# Patient Record
Sex: Female | Born: 1975
Health system: Southern US, Community
[De-identification: ages and names within clinical notes are randomized; demographics above are authoritative.]

## PROBLEM LIST (undated history)

## (undated) DIAGNOSIS — F32A Depression, unspecified: Secondary | ICD-10-CM

## (undated) DIAGNOSIS — E079 Disorder of thyroid, unspecified: Secondary | ICD-10-CM

## (undated) DIAGNOSIS — F329 Major depressive disorder, single episode, unspecified: Secondary | ICD-10-CM

## (undated) DIAGNOSIS — T7840XA Allergy, unspecified, initial encounter: Secondary | ICD-10-CM

## (undated) DIAGNOSIS — R87619 Unspecified abnormal cytological findings in specimens from cervix uteri: Secondary | ICD-10-CM

## (undated) DIAGNOSIS — F419 Anxiety disorder, unspecified: Secondary | ICD-10-CM

## (undated) DIAGNOSIS — IMO0001 Reserved for inherently not codable concepts without codable children: Secondary | ICD-10-CM

## (undated) HISTORY — DX: Anxiety disorder, unspecified: F41.9

## (undated) HISTORY — DX: Major depressive disorder, single episode, unspecified: F32.9

## (undated) HISTORY — DX: Depression, unspecified: F32.A

## (undated) HISTORY — DX: Disorder of thyroid, unspecified: E07.9

## (undated) HISTORY — DX: Reserved for inherently not codable concepts without codable children: IMO0001

## (undated) HISTORY — DX: Unspecified abnormal cytological findings in specimens from cervix uteri: R87.619

## (undated) HISTORY — DX: Allergy, unspecified, initial encounter: T78.40XA

---

## 2000-02-04 DIAGNOSIS — R87619 Unspecified abnormal cytological findings in specimens from cervix uteri: Secondary | ICD-10-CM

## 2000-02-04 HISTORY — PX: LEEP: SHX91

## 2000-02-04 HISTORY — DX: Unspecified abnormal cytological findings in specimens from cervix uteri: R87.619

## 2005-02-03 DIAGNOSIS — E079 Disorder of thyroid, unspecified: Secondary | ICD-10-CM

## 2005-02-03 HISTORY — DX: Disorder of thyroid, unspecified: E07.9

## 2008-02-04 HISTORY — PX: BILATERAL CARPAL TUNNEL RELEASE: SHX6508

## 2014-02-03 DIAGNOSIS — F419 Anxiety disorder, unspecified: Secondary | ICD-10-CM

## 2014-02-03 HISTORY — DX: Anxiety disorder, unspecified: F41.9

## 2015-05-04 ENCOUNTER — Encounter: Payer: Self-pay | Admitting: Internal Medicine

## 2015-05-04 ENCOUNTER — Ambulatory Visit (INDEPENDENT_AMBULATORY_CARE_PROVIDER_SITE_OTHER): Payer: Self-pay | Admitting: Internal Medicine

## 2015-05-04 VITALS — BP 128/84 | HR 78 | Resp 17 | Ht 64.0 in | Wt 272.0 lb

## 2015-05-04 DIAGNOSIS — E039 Hypothyroidism, unspecified: Secondary | ICD-10-CM

## 2015-05-04 DIAGNOSIS — Z91048 Other nonmedicinal substance allergy status: Secondary | ICD-10-CM

## 2015-05-04 DIAGNOSIS — Z9109 Other allergy status, other than to drugs and biological substances: Secondary | ICD-10-CM

## 2015-05-04 DIAGNOSIS — F411 Generalized anxiety disorder: Secondary | ICD-10-CM | POA: Insufficient documentation

## 2015-05-04 DIAGNOSIS — F329 Major depressive disorder, single episode, unspecified: Secondary | ICD-10-CM

## 2015-05-04 DIAGNOSIS — F32A Depression, unspecified: Secondary | ICD-10-CM

## 2015-05-04 MED ORDER — DULOXETINE HCL 60 MG PO CPEP: 60.0000 mg | ORAL_CAPSULE | Freq: Every day | ORAL | Status: DC

## 2015-05-04 MED ORDER — CETIRIZINE HCL 10 MG PO TABS
10.0000 mg | ORAL_TABLET | Freq: Every day | ORAL | Status: DC
Start: 2015-05-04 — End: 2015-08-10

## 2015-05-04 MED ORDER — LEVOTHYROXINE SODIUM 75 MCG PO TABS: 75.0000 ug | ORAL_TABLET | Freq: Every day | ORAL | Status: DC

## 2015-05-04 NOTE — Progress Notes (Addendum)
   Subjective:    Patient ID: Victoria Dixon, female    DOB: 07/31/1975, 40 y.o.   MRN: [id number]  HPI   1.  Depression and GAD:  Depression diagnosed in 1998 and GAD diagnosed 2016.  Feels she is doing well on Cymbalta.  Took Zoloft for many years, but at one point, felt it lost its effectiveness.    2.  Hypothyroidism:  Diagnosed 2007.  Has been on current dose for years.  Last TSH was 2 years ago.  3.  Vitamin D deficient:  Takes 5,000 iu daily.  Diagnosed about 4 years ago.  4.  Environmental and probably seasonal allergies:  Currently taking Loratadine 10 mg daily and has done so for years.  Able to get Zyrtec 10 mg daily less expensively at [facility].  She is willing to try that.    Current outpatient prescriptions:  .  DULoxetine (CYMBALTA) 60 MG capsule, Take 60 mg by mouth daily., Disp: , Rfl:  .  levothyroxine (SYNTHROID, LEVOTHROID) 75 MCG tablet, Take 75 mcg by mouth daily before breakfast., Disp: , Rfl:   Loratidine 10 mg by mouth once daily Vitamin D 5,000 IU daily  Allergies  Allergen Reactions  . Azithromycin Nausea Only    And dizziness   Past Medical History  Diagnosis Date  . Allergy     Eyes, nose symptoms  . Anxiety 2016  . Depression     1998  . Abnormal Pap smear of cervix 2002    LEEP procedure followed by cryosurgery  . Thyroid disease 2007    Hypothyroidism   Past Surgical History  Procedure Laterality Date  . Leep  2002    For abnormal pap, followed by cryosurgery  . Bilateral carpal tunnel release Bilateral 2010   Family History  Problem Relation Age of Onset  . Arthritis Mother     Mixed Connective Tissue Disease/Sjogrens  . Hypertension Mother   . Depression Mother   . Anxiety disorder Mother   . Prostate cancer Father   . Bradycardia Father   . Heart disease Sister     2 MIs  . Eczema Sister   . Mental illness Sister   . Irritable bowel syndrome Sister      Review of Systems     Objective:   Physical Exam  HEENT:  PERRL,  EOMI, conjunctivae mildly injected, TMs pearly gray, throat without injection or cobbling. Neck:  Supple, No adenopathy or thyromeglay Chest:  CTA CV:  RRR without murmur or rub, radial pulses normal and equal Extrems:  No edema      Assessment & Plan:  1.  Depression and GAD:  Will fill out paperwork for Cymbalta.  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  Not interested in counseling.  2.  Environmental Allergies:  Better coverage with Zyrtec at [facility]--sent.  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  Hypothyroidism:  Check TSH with fasting labs with CPE in 3-4 months

## 2015-05-04 NOTE — Patient Instructions (Signed)
Drink a glass of water before every meal Drink 6-8 glasses of water daily Eat three meals daily Eat a protein and healthy fat with every meal (eggs,fish, chicken, [place] and limit red meats) Eat 5 servings of vegetables daily, mix the colors Eat 2 servings of fruit daily with skin, if skin is edible Use smaller plates Put food/utensils down as you chew and swallow each bite Eat at a table with friends/family at least once daily, no TV Do not eat in front of the TV

## 2015-05-14 ENCOUNTER — Telehealth: Payer: Self-pay | Admitting: Internal Medicine

## 2015-05-14 ENCOUNTER — Other Ambulatory Visit: Payer: Self-pay | Admitting: Internal Medicine

## 2015-05-14 DIAGNOSIS — F329 Major depressive disorder, single episode, unspecified: Secondary | ICD-10-CM

## 2015-05-14 DIAGNOSIS — F32A Depression, unspecified: Secondary | ICD-10-CM

## 2015-05-14 MED ORDER — DULOXETINE HCL 60 MG PO CPEP: 60.0000 mg | ORAL_CAPSULE | Freq: Every day | ORAL | Status: DC

## 2015-05-14 NOTE — Telephone Encounter (Signed)
Patient called to check if Dr. Amil Amen had faxed refill information to [facility] for "CYMBALTA".  Patient only has 3 days left of Rx.  Patient stated she left form for fax with Dr. Amil Amen.  Patient stated that Dr. Amil Amen was to send this in as an Urgent Fax request.  Patient can be reached at [phone].

## 2015-05-22 ENCOUNTER — Telehealth: Payer: Self-pay | Admitting: Internal Medicine

## 2015-05-22 NOTE — Telephone Encounter (Signed)
Called and spoke with patient.  Notified her Cymbalta through [facility] is here x 4 months She will pick up tomorrow

## 2015-08-03 ENCOUNTER — Ambulatory Visit: Payer: No Typology Code available for payment source | Admitting: Internal Medicine

## 2015-08-10 ENCOUNTER — Encounter: Payer: Self-pay | Admitting: Internal Medicine

## 2015-08-10 ENCOUNTER — Ambulatory Visit (INDEPENDENT_AMBULATORY_CARE_PROVIDER_SITE_OTHER): Payer: BLUE CROSS/BLUE SHIELD | Admitting: Internal Medicine

## 2015-08-10 VITALS — BP 110/68 | HR 89 | Resp 16 | Ht 64.0 in | Wt 284.0 lb

## 2015-08-10 DIAGNOSIS — E038 Other specified hypothyroidism: Secondary | ICD-10-CM

## 2015-08-10 DIAGNOSIS — E559 Vitamin D deficiency, unspecified: Secondary | ICD-10-CM | POA: Diagnosis not present

## 2015-08-10 DIAGNOSIS — F411 Generalized anxiety disorder: Secondary | ICD-10-CM | POA: Diagnosis not present

## 2015-08-10 DIAGNOSIS — F32A Depression, unspecified: Secondary | ICD-10-CM

## 2015-08-10 DIAGNOSIS — J3089 Other allergic rhinitis: Secondary | ICD-10-CM | POA: Insufficient documentation

## 2015-08-10 DIAGNOSIS — E034 Atrophy of thyroid (acquired): Secondary | ICD-10-CM | POA: Insufficient documentation

## 2015-08-10 DIAGNOSIS — Z6841 Body Mass Index (BMI) 40.0 and over, adult: Secondary | ICD-10-CM

## 2015-08-10 DIAGNOSIS — F329 Major depressive disorder, single episode, unspecified: Secondary | ICD-10-CM

## 2015-08-10 MED ORDER — PHENTERMINE-TOPIRAMATE ER 7.5-46 MG PO CP24: 1.0000 | ORAL_CAPSULE | Freq: Every day | ORAL | Status: DC

## 2015-08-10 NOTE — Patient Instructions (Signed)
Breast Self-Awareness Practicing breast self-awareness may pick up problems early, prevent significant medical complications, and possibly save your life. By practicing breast self-awareness, you can become familiar with how your breasts look and feel and if your breasts are changing. This allows you to notice changes early. It can also offer you some reassurance that your breast health is good. One way to learn what is normal for your breasts and whether your breasts are changing is to do a breast self-exam. If you find a lump or something that was not present in the past, it is best to contact your caregiver right away. Other findings that should be evaluated by your caregiver include nipple discharge, especially if it is bloody; skin changes or reddening; areas where the skin seems to be pulled in (retracted); or new lumps and bumps. Breast pain is seldom associated with cancer (malignancy), but should also be evaluated by a caregiver. HOW TO PERFORM A BREAST SELF-EXAM The best time to examine your breasts is 5-7 days after your menstrual period is over. During menstruation, the breasts are lumpier, and it may be more difficult to pick up changes. If you do not menstruate, have reached menopause, or had your uterus removed (hysterectomy), you should examine your breasts at regular intervals, such as monthly. If you are breastfeeding, examine your breasts after a feeding or after using a breast pump. Breast implants do not decrease the risk for lumps or tumors, so continue to perform breast self-exams as recommended. Talk to your caregiver about how to determine the difference between the implant and breast tissue. Also, talk about the amount of pressure you should use during the exam. Over time, you will become more familiar with the variations of your breasts and more comfortable with the exam. A breast self-exam requires you to remove all your clothes above the waist. 1. Look at your breasts and nipples.  Stand in front of a mirror in a room with good lighting. With your hands on your hips, push your hands firmly downward. Look for a difference in shape, contour, and size from one breast to the other (asymmetry). Asymmetry includes puckers, dips, or bumps. Also, look for skin changes, such as reddened or scaly areas on the breasts. Look for nipple changes, such as discharge, dimpling, repositioning, or redness. 2. Carefully feel your breasts. This is best done either in the shower or tub while using soapy water or when flat on your back. Place the arm (on the side of the breast you are examining) above your head. Use the pads (not the fingertips) of your three middle fingers on your opposite hand to feel your breasts. Start in the underarm area and use  inch (2 cm) overlapping circles to feel your breast. Use 3 different levels of pressure (light, medium, and firm pressure) at each circle before moving to the next circle. The light pressure is needed to feel the tissue closest to the skin. The medium pressure will help to feel breast tissue a little deeper, while the firm pressure is needed to feel the tissue close to the ribs. Continue the overlapping circles, moving downward over the breast until you feel your ribs below your breast. Then, move one finger-width towards the center of the body. Continue to use the  inch (2 cm) overlapping circles to feel your breast as you move slowly up toward the collar bone (clavicle) near the base of the neck. Continue the up and down exam using all 3 pressures until you reach the  middle of the chest. Do this with each breast, carefully feeling for lumps or changes. 3.  Keep a written record with breast changes or normal findings for each breast. By writing this information down, you do not need to depend only on memory for size, tenderness, or location. Write down where you are in your menstrual cycle, if you are still menstruating. Breast tissue can have some lumps or  thick tissue. However, see your caregiver if you find anything that concerns you.  SEEK MEDICAL CARE IF:  You see a change in shape, contour, or size of your breasts or nipples.   You see skin changes, such as reddened or scaly areas on the breasts or nipples.   You have an unusual discharge from your nipples.   You feel a new lump or unusually thick areas.    This information is not intended to replace advice given to you by your health care provider. Make sure you discuss any questions you have with your health care provider.   Document Released: 01/20/2005 Document Revised: 01/07/2012 Document Reviewed: 05/07/2011 Elsevier Interactive Patient Education [employer].

## 2015-08-10 NOTE — Progress Notes (Signed)
Date:  08/10/2015   Name:  Victoria Dixon   DOB:  [birth year]   MRN:  [id number]   Chief Complaint: Establish Care; Hypothyroidism; and Depression Depression        This is a chronic problem.  The onset quality is gradual.   The problem occurs rarely.  The problem has been resolved since onset.  Associated symptoms include no fatigue and no headaches.  Past treatments include SNRIs - Serotonin and norepinephrine reuptake inhibitors.  Compliance with treatment is good.  Previous treatment provided significant relief.  Past medical history includes thyroid problem.   Thyroid Problem Presents for follow-up visit. The condition has lasted for 14 years. Patient reports no anxiety, constipation, depressed mood, diarrhea, fatigue, leg swelling, menstrual problem or palpitations. The symptoms have been stable (has not had labs in more than one year). Past treatments include levothyroxine. The treatment provided significant relief.   Vitamin D Def - has been low over the past few years.  Now on high dose supplements. Has not had labs done in some time.  Obesity - she previously took Qsymia and did very well - lost about 70 lbs in a year.  However, the number of authorized refills expired and she has gained back some weight.   Review of Systems  Constitutional: Positive for unexpected weight change. Negative for fever, chills and fatigue.  Respiratory: Negative for cough, chest tightness, shortness of breath and wheezing.   Cardiovascular: Negative for chest pain, palpitations and leg swelling.  Gastrointestinal: Negative for abdominal pain, diarrhea and constipation.  Genitourinary: Negative for menstrual problem.  Allergic/Immunologic: Positive for environmental allergies.  Neurological: Negative for dizziness and headaches.  Psychiatric/Behavioral: Positive for depression. Negative for dysphoric mood. The patient is not nervous/anxious.     Patient Active Problem List   Diagnosis Date Noted    . Environmental and seasonal allergies 08/10/2015  . Hypothyroidism due to acquired atrophy of thyroid 08/10/2015  . Vitamin D deficiency 08/10/2015  . Depression 05/04/2015  . Generalized anxiety disorder 05/04/2015    Prior to Admission medications   Medication Sig Start Date End Date Taking? Authorizing Provider  DULoxetine (CYMBALTA) 60 MG capsule Take 1 capsule (60 mg total) by mouth daily. 05/14/15  Yes Mack Hook, MD  etonogestrel (NEXPLANON) 68 MG IMPL implant 1 each by Subdermal route once.   Yes Historical Provider, MD  fexofenadine (ALLEGRA) 180 MG tablet Take 180 mg by mouth daily.   Yes Historical Provider, MD  levothyroxine (SYNTHROID, LEVOTHROID) 75 MCG tablet Take 1 tablet (75 mcg total) by mouth daily before breakfast. 05/04/15  Yes Mack Hook, MD  Vitamin D, Ergocalciferol, (DRISDOL) 50000 units CAPS capsule Take 50,000 Units by mouth every 7 (seven) days.   Yes Historical Provider, MD    Allergies  Allergen Reactions  . Azithromycin Nausea Only    And dizziness    Past Surgical History  Procedure Laterality Date  . Leep  2002    For abnormal pap, followed by cryosurgery  . Bilateral carpal tunnel release Bilateral 2010    Social History  Substance Use Topics  . Smoking status: Never Smoker   . Smokeless tobacco: Never Used  . Alcohol Use: 0.0 oz/week    0 Standard drinks or equivalent per week     Comment: Rarely     Medication list has been reviewed and updated.   Physical Exam  Constitutional: She is oriented to person, place, and time. She appears well-developed. No distress.  HENT:  Head: Normocephalic  and atraumatic.  Neck: Normal range of motion. Neck supple. Carotid bruit is not present. No thyromegaly present.  Cardiovascular: Normal rate, regular rhythm and normal heart sounds.   Pulmonary/Chest: Effort normal and breath sounds normal. No respiratory distress.  Musculoskeletal: Normal range of motion.  Lymphadenopathy:     She has no cervical adenopathy.  Neurological: She is alert and oriented to person, place, and time. She has normal reflexes.  Skin: Skin is warm and dry. No rash noted.  Psychiatric: She has a normal mood and affect. Her speech is normal and behavior is normal. Thought content normal.  Nursing note and vitals reviewed.   BP 110/68 mmHg  Pulse 89  Resp 16  Ht 5' 4"  (1.626 m)  Wt 284 lb (128.822 kg)  BMI 48.72 kg/m2  SpO2 98%  Assessment and Plan: 1. Hypothyroidism due to acquired atrophy of thyroid Continue current dose - will adjust if needed - Lipid panel - TSH  2. Depression Doing well on Cymbalta  3. Generalized anxiety disorder controlled  4. Vitamin D deficiency supplemented - VITAMIN D 25 Hydroxy (Vit-D Deficiency, Fractures)  5. Adult BMI 45.0-49.9 kg/sq m ([place]) Will start with lower dose medication - will need to get PA and patient aware it might not be approved - Phentermine-Topiramate (QSYMIA) 7.5-46 MG CP24; Take 1 capsule by mouth daily.  Dispense: 14 capsule; Refill: 0 - Comprehensive metabolic panel   Halina Maidens, MD [facility]  08/10/2015

## 2015-08-11 LAB — COMPREHENSIVE METABOLIC PANEL
ALT: 14 IU/L (ref 0–32)
AST: 16 IU/L (ref 0–40)
Albumin/Globulin Ratio: 1.3 (ref 1.2–2.2)
Albumin: 3.9 g/dL (ref 3.5–5.5)
Alkaline Phosphatase: 100 IU/L (ref 39–117)
BUN/Creatinine Ratio: 16 (ref 9–23)
BUN: 10 mg/dL (ref 6–20)
Bilirubin Total: 0.4 mg/dL (ref 0.0–1.2)
CO2: 25 mmol/L (ref 18–29)
Calcium: 9.3 mg/dL (ref 8.7–10.2)
Chloride: 102 mmol/L (ref 96–106)
Creatinine, Ser: 0.61 mg/dL (ref 0.57–1.00)
GFR calc Af Amer: 132 mL/min/{1.73_m2} (ref >59)
GFR calc non Af Amer: 115 mL/min/{1.73_m2} (ref >59)
Globulin, Total: 3 g/dL (ref 1.5–4.5)
Glucose: 102 mg/dL — ABNORMAL HIGH (ref 65–99)
Potassium: 4.7 mmol/L (ref 3.5–5.2)
Sodium: 142 mmol/L (ref 134–144)
Total Protein: 6.9 g/dL (ref 6.0–8.5)

## 2015-08-11 LAB — LIPID PANEL
Chol/HDL Ratio: 2.9 ratio units (ref 0.0–4.4)
Cholesterol, Total: 158 mg/dL (ref 100–199)
HDL: 54 mg/dL (ref >39)
LDL Calculated: 87 mg/dL (ref 0–99)
Triglycerides: 84 mg/dL (ref 0–149)
VLDL Cholesterol Cal: 17 mg/dL (ref 5–40)

## 2015-08-11 LAB — VITAMIN D 25 HYDROXY (VIT D DEFICIENCY, FRACTURES): Vit D, 25-Hydroxy: 48.5 ng/mL (ref 30.0–100.0)

## 2015-08-11 LAB — TSH: TSH: 0.665 u[IU]/mL (ref 0.450–4.500)

## 2015-08-17 ENCOUNTER — Telehealth: Payer: Self-pay

## 2015-08-17 NOTE — Telephone Encounter (Signed)
Sent PA to plan and am awaiting review for Qysmia

## 2015-08-20 ENCOUNTER — Telehealth: Payer: Self-pay

## 2015-08-20 NOTE — Telephone Encounter (Signed)
Patient called to check on the status of her PA for Qsymia, notified her that the PA has been started and that we are waiting for a determination.

## 2015-08-23 ENCOUNTER — Other Ambulatory Visit: Payer: Self-pay

## 2015-08-23 NOTE — Telephone Encounter (Signed)
Sent by fax to [facility] clinic as the patient has switched her PCP to [place].

## 2015-08-24 ENCOUNTER — Other Ambulatory Visit: Payer: Self-pay | Admitting: Internal Medicine

## 2015-08-24 ENCOUNTER — Telehealth: Payer: Self-pay

## 2015-08-24 DIAGNOSIS — Z6841 Body Mass Index (BMI) 40.0 and over, adult: Secondary | ICD-10-CM

## 2015-08-24 MED ORDER — PHENTERMINE-TOPIRAMATE ER 7.5-46 MG PO CP24: 1.0000 | ORAL_CAPSULE | Freq: Every day | ORAL | Status: DC

## 2015-08-24 NOTE — Telephone Encounter (Signed)
Patient wants Qysmia sent for 30 days to [facility] in [place]

## 2015-08-26 ENCOUNTER — Encounter: Payer: Self-pay | Admitting: Internal Medicine

## 2015-08-26 ENCOUNTER — Other Ambulatory Visit: Payer: Self-pay | Admitting: Internal Medicine

## 2015-08-26 DIAGNOSIS — E039 Hypothyroidism, unspecified: Secondary | ICD-10-CM

## 2015-08-26 MED ORDER — LEVOTHYROXINE SODIUM 75 MCG PO TABS: 75.0000 ug | ORAL_TABLET | Freq: Every day | ORAL | 11 refills | Status: DC

## 2015-09-19 ENCOUNTER — Encounter: Payer: Self-pay | Admitting: Internal Medicine

## 2015-09-20 ENCOUNTER — Other Ambulatory Visit: Payer: Self-pay | Admitting: Internal Medicine

## 2015-09-20 DIAGNOSIS — F329 Major depressive disorder, single episode, unspecified: Secondary | ICD-10-CM

## 2015-09-20 DIAGNOSIS — F32A Depression, unspecified: Secondary | ICD-10-CM

## 2015-09-20 MED ORDER — DULOXETINE HCL 60 MG PO CPEP: 60.0000 mg | ORAL_CAPSULE | Freq: Every day | ORAL | 5 refills | Status: DC

## 2015-10-17 ENCOUNTER — Other Ambulatory Visit: Payer: Self-pay | Admitting: Internal Medicine

## 2015-10-17 DIAGNOSIS — Z6841 Body Mass Index (BMI) 40.0 and over, adult: Secondary | ICD-10-CM

## 2015-10-17 MED ORDER — PHENTERMINE-TOPIRAMATE ER 7.5-46 MG PO CP24: 1.0000 | ORAL_CAPSULE | Freq: Every day | ORAL | 1 refills | Status: DC

## 2015-10-18 ENCOUNTER — Other Ambulatory Visit: Payer: Self-pay | Admitting: Internal Medicine

## 2015-10-18 DIAGNOSIS — Z6841 Body Mass Index (BMI) 40.0 and over, adult: Secondary | ICD-10-CM

## 2015-11-27 ENCOUNTER — Other Ambulatory Visit: Payer: Self-pay | Admitting: Internal Medicine

## 2015-11-27 DIAGNOSIS — Z1231 Encounter for screening mammogram for malignant neoplasm of breast: Secondary | ICD-10-CM

## 2015-12-13 ENCOUNTER — Ambulatory Visit
Admission: RE | Admit: 2015-12-13 | Discharge: 2015-12-13 | Disposition: A | Discharge status: Home / Self Care | Payer: BLUE CROSS/BLUE SHIELD | Source: Ambulatory Visit | Attending: Internal Medicine | Admitting: Internal Medicine

## 2015-12-13 DIAGNOSIS — Z1231 Encounter for screening mammogram for malignant neoplasm of breast: Secondary | ICD-10-CM | POA: Diagnosis not present

## 2015-12-14 ENCOUNTER — Other Ambulatory Visit: Payer: Self-pay | Admitting: Internal Medicine

## 2015-12-14 DIAGNOSIS — N6489 Other specified disorders of breast: Secondary | ICD-10-CM

## 2015-12-19 DIAGNOSIS — H52223 Regular astigmatism, bilateral: Secondary | ICD-10-CM | POA: Diagnosis not present

## 2015-12-19 DIAGNOSIS — H524 Presbyopia: Secondary | ICD-10-CM | POA: Diagnosis not present

## 2015-12-19 DIAGNOSIS — H5213 Myopia, bilateral: Secondary | ICD-10-CM | POA: Diagnosis not present

## 2015-12-28 ENCOUNTER — Encounter: Payer: Self-pay | Admitting: Internal Medicine

## 2015-12-28 DIAGNOSIS — R928 Other abnormal and inconclusive findings on diagnostic imaging of breast: Secondary | ICD-10-CM | POA: Insufficient documentation

## 2015-12-28 DIAGNOSIS — Z6841 Body Mass Index (BMI) 40.0 and over, adult: Secondary | ICD-10-CM | POA: Insufficient documentation

## 2016-01-01 ENCOUNTER — Other Ambulatory Visit: Payer: Self-pay | Admitting: Internal Medicine

## 2016-01-01 ENCOUNTER — Ambulatory Visit (INDEPENDENT_AMBULATORY_CARE_PROVIDER_SITE_OTHER): Payer: BLUE CROSS/BLUE SHIELD | Admitting: Internal Medicine

## 2016-01-01 ENCOUNTER — Encounter: Payer: Self-pay | Admitting: Internal Medicine

## 2016-01-01 VITALS — BP 124/64 | HR 62 | Ht 64.0 in | Wt 276.0 lb

## 2016-01-01 DIAGNOSIS — F411 Generalized anxiety disorder: Secondary | ICD-10-CM

## 2016-01-01 DIAGNOSIS — Z6841 Body Mass Index (BMI) 40.0 and over, adult: Secondary | ICD-10-CM

## 2016-01-01 DIAGNOSIS — Z0000 Encounter for general adult medical examination without abnormal findings: Secondary | ICD-10-CM | POA: Diagnosis not present

## 2016-01-01 DIAGNOSIS — E6609 Other obesity due to excess calories: Secondary | ICD-10-CM | POA: Diagnosis not present

## 2016-01-01 DIAGNOSIS — R928 Other abnormal and inconclusive findings on diagnostic imaging of breast: Secondary | ICD-10-CM | POA: Diagnosis not present

## 2016-01-01 DIAGNOSIS — E034 Atrophy of thyroid (acquired): Secondary | ICD-10-CM | POA: Diagnosis not present

## 2016-01-01 DIAGNOSIS — F324 Major depressive disorder, single episode, in partial remission: Secondary | ICD-10-CM

## 2016-01-01 LAB — POCT URINALYSIS DIPSTICK
Bilirubin, UA: NEGATIVE
Blood, UA: NEGATIVE
Glucose, UA: NEGATIVE
Ketones, UA: NEGATIVE
Leukocytes, UA: NEGATIVE
Nitrite, UA: NEGATIVE
Protein, UA: NEGATIVE
Spec Grav, UA: 1.015
Urobilinogen, UA: 0.2
pH, UA: 6.5

## 2016-01-01 NOTE — Progress Notes (Signed)
Date:  01/01/2016   Name:  Victoria Dixon   DOB:  Jul 29, 1975   MRN:  [id number]   Chief Complaint: Annual Exam Victoria Dixon is a 40 y.o. female who presents today for her Complete Annual Exam. She feels well. She reports exercising none. She reports she is sleeping well. She recently had a mammogram that requires some additional views (pending). She denies breast problems or mass.  Had normal Pap 2 years ago.  Thyroid Problem  Presents for follow-up visit. Patient reports no anxiety, constipation, depressed mood, diaphoresis, diarrhea, fatigue, hair loss, palpitations, tremors or weight gain. The symptoms have been stable.  Anxiety  Presents for follow-up visit. Patient reports no chest pain, depressed mood, dizziness, excessive worry, insomnia, nervous/anxious behavior, palpitations, shortness of breath or suicidal ideas. Symptoms occur occasionally.     Obesity - taking Qysimia without side effects.  She admits that she is not exercising but does stay active with dog shelter volunteering.  Her job is very sedentary.  She is eating less in portion but has not changed the types of food.  Wt Readings from Last 3 Encounters:  01/01/16 276 lb (125.2 kg)  08/10/15 284 lb (128.8 kg)  05/04/15 272 lb (123.4 kg)     Review of Systems  Constitutional: Negative for chills, diaphoresis, fatigue, fever and weight gain.  HENT: Negative for congestion, hearing loss, tinnitus, trouble swallowing and voice change.   Eyes: Negative for visual disturbance.  Respiratory: Negative for cough, chest tightness, shortness of breath and wheezing.   Cardiovascular: Negative for chest pain, palpitations and leg swelling.  Gastrointestinal: Negative for abdominal pain, constipation, diarrhea and vomiting.  Endocrine: Negative for polydipsia and polyuria.  Genitourinary: Negative for dysuria, frequency, genital sores, vaginal bleeding and vaginal discharge.  Musculoskeletal: Negative for  arthralgias, gait problem and joint swelling.  Skin: Negative for color change and rash.  Neurological: Negative for dizziness, tremors, light-headedness and headaches.  Hematological: Negative for adenopathy. Does not bruise/bleed easily.  Psychiatric/Behavioral: Negative for dysphoric mood, sleep disturbance and suicidal ideas. The patient is not nervous/anxious and does not have insomnia.     Patient Active Problem List   Diagnosis Date Noted  . Major depressive disorder with single episode, in partial remission ([place]) 01/01/2016  . Abnormal mammogram 12/28/2015  . BMI 45.0-49.9, adult ([place]) 12/28/2015  . Environmental and seasonal allergies 08/10/2015  . Hypothyroidism due to acquired atrophy of thyroid 08/10/2015  . Vitamin D deficiency 08/10/2015  . Depression 05/04/2015  . Generalized anxiety disorder 05/04/2015    Prior to Admission medications   Medication Sig Start Date End Date Taking? Authorizing Provider  DULoxetine (CYMBALTA) 60 MG capsule Take 1 capsule (60 mg total) by mouth daily. 09/20/15  Yes Glean Hess, MD  etonogestrel (NEXPLANON) 68 MG IMPL implant 1 each by Subdermal route once.   Yes Historical Provider, MD  fexofenadine (ALLEGRA) 180 MG tablet Take 180 mg by mouth daily.   Yes Historical Provider, MD  levothyroxine (SYNTHROID, LEVOTHROID) 75 MCG tablet Take 1 tablet (75 mcg total) by mouth daily before breakfast. 08/26/15  Yes Glean Hess, MD  Phentermine-Topiramate [facility]) 7.5-46 MG CP24 Take 1 capsule by mouth daily. 10/17/15  Yes Glean Hess, MD  Vitamin D, Ergocalciferol, (DRISDOL) 50000 units CAPS capsule Take 50,000 Units by mouth every 7 (seven) days.   Yes Historical Provider, MD    Allergies  Allergen Reactions  . Azithromycin Nausea Only    And dizziness    Past  Surgical History:  Procedure Laterality Date  . BILATERAL CARPAL TUNNEL RELEASE Bilateral 2010  . LEEP  2002   For abnormal pap, followed by cryosurgery    Social  History  Substance Use Topics  . Smoking status: Never Smoker  . Smokeless tobacco: Never Used  . Alcohol use 0.6 oz/week    1 Standard drinks or equivalent per week     Comment: Rarely     Medication list has been reviewed and updated.   Physical Exam  Constitutional: She is oriented to person, place, and time. She appears well-developed and well-nourished. No distress.  HENT:  Head: Normocephalic and atraumatic.  Right Ear: Tympanic membrane and ear canal normal.  Left Ear: Tympanic membrane and ear canal normal.  Nose: Right sinus exhibits no maxillary sinus tenderness. Left sinus exhibits no maxillary sinus tenderness.  Mouth/Throat: Uvula is midline and oropharynx is clear and moist.  Eyes: Conjunctivae and EOM are normal. Right eye exhibits no discharge. Left eye exhibits no discharge. No scleral icterus.  Neck: Normal range of motion. Carotid bruit is not present. No erythema present. No thyromegaly present.  Cardiovascular: Normal rate, regular rhythm, normal heart sounds and normal pulses.   Pulmonary/Chest: Effort normal. No respiratory distress. She has no wheezes.  Abdominal: Soft. Bowel sounds are normal. There is no hepatosplenomegaly. There is no tenderness. There is no CVA tenderness.  Musculoskeletal: Normal range of motion.  Lymphadenopathy:    She has no cervical adenopathy.    She has no axillary adenopathy.  Neurological: She is alert and oriented to person, place, and time. She has normal reflexes. No cranial nerve deficit or sensory deficit.  Skin: Skin is warm, dry and intact. No rash noted.  Psychiatric: She has a normal mood and affect. Her speech is normal and behavior is normal. Thought content normal.  Nursing note and vitals reviewed.   BP 124/64   Pulse 62   Ht 5' 4"  (1.626 m)   Wt 276 lb (125.2 kg)   BMI 47.38 kg/m   Assessment and Plan: 1. Annual physical exam Additional views of right breast pending Recommend monthly self exams Patient  declined Flu vaccine - CBC with Differential/Platelet - Comprehensive metabolic panel - Lipid panel - POCT urinalysis dipstick  2. Hypothyroidism due to acquired atrophy of thyroid supplemented - TSH  3. Generalized anxiety disorder Doing fairly well on current therapy  4. Major depressive disorder with single episode, in partial remission ([place]) Continue cymbalta  5. BMI 45.0-49.9, adult [facility]) Continue medication Discussed more exercise - such as walking during her lunch break daily Follow up in 3 months to re-evaluate  Halina Maidens, MD [facility]  01/01/2016

## 2016-01-02 LAB — CBC WITH DIFFERENTIAL/PLATELET
Basophils Absolute: 0 10*3/uL (ref 0.0–0.2)
Basos: 1 %
EOS (ABSOLUTE): 0.3 10*3/uL (ref 0.0–0.4)
Eos: 3 %
Hematocrit: 43.2 % (ref 34.0–46.6)
Hemoglobin: 14.3 g/dL (ref 11.1–15.9)
Immature Grans (Abs): 0 10*3/uL (ref 0.0–0.1)
Immature Granulocytes: 0 %
Lymphocytes Absolute: 2.4 10*3/uL (ref 0.7–3.1)
Lymphs: 33 %
MCH: 30.6 pg (ref 26.6–33.0)
MCHC: 33.1 g/dL (ref 31.5–35.7)
MCV: 92 fL (ref 79–97)
Monocytes Absolute: 0.3 10*3/uL (ref 0.1–0.9)
Monocytes: 4 %
Neutrophils Absolute: 4.4 10*3/uL (ref 1.4–7.0)
Neutrophils: 59 %
Platelets: 254 10*3/uL (ref 150–379)
RBC: 4.68 x10E6/uL (ref 3.77–5.28)
RDW: 13.2 % (ref 12.3–15.4)
WBC: 7.4 10*3/uL (ref 3.4–10.8)

## 2016-01-02 LAB — COMPREHENSIVE METABOLIC PANEL
ALT: 12 IU/L (ref 0–32)
AST: 12 IU/L (ref 0–40)
Albumin/Globulin Ratio: 1.3 (ref 1.2–2.2)
Albumin: 3.9 g/dL (ref 3.5–5.5)
Alkaline Phosphatase: 102 IU/L (ref 39–117)
BUN/Creatinine Ratio: 11 (ref 9–23)
BUN: 7 mg/dL (ref 6–24)
Bilirubin Total: 0.5 mg/dL (ref 0.0–1.2)
CO2: 24 mmol/L (ref 18–29)
Calcium: 9 mg/dL (ref 8.7–10.2)
Chloride: 103 mmol/L (ref 96–106)
Creatinine, Ser: 0.64 mg/dL (ref 0.57–1.00)
GFR calc Af Amer: 129 mL/min/{1.73_m2} (ref >59)
GFR calc non Af Amer: 112 mL/min/{1.73_m2} (ref >59)
Globulin, Total: 3 g/dL (ref 1.5–4.5)
Glucose: 88 mg/dL (ref 65–99)
Potassium: 4.9 mmol/L (ref 3.5–5.2)
Sodium: 140 mmol/L (ref 134–144)
Total Protein: 6.9 g/dL (ref 6.0–8.5)

## 2016-01-02 LAB — LIPID PANEL
Chol/HDL Ratio: 4 ratio units (ref 0.0–4.4)
Cholesterol, Total: 170 mg/dL (ref 100–199)
HDL: 42 mg/dL (ref >39)
LDL Calculated: 107 mg/dL — ABNORMAL HIGH (ref 0–99)
Triglycerides: 107 mg/dL (ref 0–149)
VLDL Cholesterol Cal: 21 mg/dL (ref 5–40)

## 2016-01-02 LAB — TSH: TSH: 0.395 u[IU]/mL — ABNORMAL LOW (ref 0.450–4.500)

## 2016-01-03 ENCOUNTER — Ambulatory Visit
Admission: RE | Admit: 2016-01-03 | Discharge: 2016-01-03 | Disposition: A | Discharge status: Home / Self Care | Payer: BLUE CROSS/BLUE SHIELD | Source: Ambulatory Visit | Attending: Internal Medicine | Admitting: Internal Medicine

## 2016-01-03 ENCOUNTER — Other Ambulatory Visit: Payer: Self-pay | Admitting: Internal Medicine

## 2016-01-03 DIAGNOSIS — N6489 Other specified disorders of breast: Secondary | ICD-10-CM

## 2016-01-03 DIAGNOSIS — R928 Other abnormal and inconclusive findings on diagnostic imaging of breast: Secondary | ICD-10-CM | POA: Diagnosis not present

## 2016-01-18 ENCOUNTER — Other Ambulatory Visit: Payer: Self-pay | Admitting: Internal Medicine

## 2016-01-18 DIAGNOSIS — F324 Major depressive disorder, single episode, in partial remission: Secondary | ICD-10-CM

## 2016-01-18 MED ORDER — DULOXETINE HCL 60 MG PO CPEP: 60.0000 mg | ORAL_CAPSULE | Freq: Every day | ORAL | 1 refills | Status: DC

## 2016-02-15 ENCOUNTER — Other Ambulatory Visit: Payer: Self-pay | Admitting: Internal Medicine

## 2016-02-15 MED ORDER — LEVOTHYROXINE SODIUM 75 MCG PO TABS: 75.0000 ug | ORAL_TABLET | Freq: Every day | ORAL | 3 refills | Status: DC

## 2016-04-03 ENCOUNTER — Ambulatory Visit: Payer: BLUE CROSS/BLUE SHIELD | Admitting: Internal Medicine

## 2016-04-25 ENCOUNTER — Ambulatory Visit (INDEPENDENT_AMBULATORY_CARE_PROVIDER_SITE_OTHER): Payer: BLUE CROSS/BLUE SHIELD | Admitting: Internal Medicine

## 2016-04-25 ENCOUNTER — Encounter: Payer: Self-pay | Admitting: Internal Medicine

## 2016-04-25 VITALS — BP 136/84 | HR 88 | Temp 98.4°F | Ht 64.0 in | Wt 288.0 lb

## 2016-04-25 DIAGNOSIS — Z23 Encounter for immunization: Secondary | ICD-10-CM | POA: Diagnosis not present

## 2016-04-25 DIAGNOSIS — L739 Follicular disorder, unspecified: Secondary | ICD-10-CM | POA: Diagnosis not present

## 2016-04-25 DIAGNOSIS — R0981 Nasal congestion: Secondary | ICD-10-CM | POA: Diagnosis not present

## 2016-04-25 MED ORDER — SULFAMETHOXAZOLE-TRIMETHOPRIM 800-160 MG PO TABS: 1.0000 | ORAL_TABLET | Freq: Two times a day (BID) | ORAL | 0 refills | Status: DC

## 2016-04-25 NOTE — Progress Notes (Signed)
Date:  04/25/2016   Name:  Victoria Dixon   DOB:  [birth year]   MRN:  [id number]   Chief Complaint: Sore Throat (Woke up with sore throat. White in back of mouth and on tongue. No fever. Will do Tdap today.) Sore Throat   This is a new problem. The current episode started yesterday. The problem has been unchanged. Neither side of throat is experiencing more pain than the other. There has been no fever. Associated symptoms include congestion. Pertinent negatives include no coughing, hoarse voice, plugged ear sensation, shortness of breath, swollen glands or trouble swallowing. She has had no exposure to strep or mono. She has tried acetaminophen for the symptoms. The treatment provided mild relief.    Review of Systems  Constitutional: Positive for fatigue. Negative for chills and fever.  HENT: Positive for congestion and sinus pressure. Negative for hoarse voice and trouble swallowing.   Respiratory: Negative for cough, chest tightness, shortness of breath and wheezing.   Cardiovascular: Negative for chest pain, palpitations and leg swelling.  Musculoskeletal: Negative for arthralgias.  Skin: Positive for rash.    Patient Active Problem List   Diagnosis Date Noted  . Major depressive disorder with single episode, in partial remission ([place]) 01/01/2016  . Abnormal mammogram 12/28/2015  . BMI 45.0-49.9, adult ([place]) 12/28/2015  . Environmental and seasonal allergies 08/10/2015  . Hypothyroidism due to acquired atrophy of thyroid 08/10/2015  . Vitamin D deficiency 08/10/2015  . Depression 05/04/2015  . Generalized anxiety disorder 05/04/2015    Prior to Admission medications   Medication Sig Start Date End Date Taking? Authorizing Provider  DULoxetine (CYMBALTA) 60 MG capsule Take 1 capsule (60 mg total) by mouth daily. 01/18/16  Yes Glean Hess, MD  etonogestrel (NEXPLANON) 68 MG IMPL implant 1 each by Subdermal route once.   Yes Historical Provider, MD  fexofenadine  (ALLEGRA) 180 MG tablet Take 180 mg by mouth daily.   Yes Historical Provider, MD  levothyroxine (SYNTHROID, LEVOTHROID) 75 MCG tablet Take 1 tablet (75 mcg total) by mouth daily before breakfast. 02/15/16  Yes Glean Hess, MD  Vitamin D, Ergocalciferol, (DRISDOL) 50000 units CAPS capsule Take 50,000 Units by mouth every 7 (seven) days.   Yes Historical Provider, MD    Allergies  Allergen Reactions  . Azithromycin Nausea Only    And dizziness    Past Surgical History:  Procedure Laterality Date  . BILATERAL CARPAL TUNNEL RELEASE Bilateral 2010  . LEEP  2002   For abnormal pap, followed by cryosurgery    Social History  Substance Use Topics  . Smoking status: Never Smoker  . Smokeless tobacco: Never Used  . Alcohol use 0.6 oz/week    1 Standard drinks or equivalent per week     Comment: Rarely     Medication list has been reviewed and updated.   Physical Exam  Constitutional: She is oriented to person, place, and time. She appears well-developed. No distress.  HENT:  Head: Normocephalic and atraumatic.  Right Ear: Tympanic membrane and ear canal normal.  Left Ear: Tympanic membrane and ear canal normal.  Nose: Right sinus exhibits no maxillary sinus tenderness and no frontal sinus tenderness. Left sinus exhibits no maxillary sinus tenderness and no frontal sinus tenderness.  Mouth/Throat: No posterior oropharyngeal erythema.  Very large cryptic tonsils - no stones or erythema noted  Cardiovascular: Normal rate, regular rhythm and normal heart sounds.   Pulmonary/Chest: Effort normal and breath sounds normal. No respiratory distress. She has  no wheezes.  Abdominal: Soft. Normal appearance and bowel sounds are normal. There is no hepatosplenomegaly. There is no rigidity and no guarding.  Musculoskeletal: Normal range of motion.  Lymphadenopathy:    She has no cervical adenopathy.  Neurological: She is alert and oriented to person, place, and time.  Skin: Skin is warm  and dry. Rash noted. Rash is papular.  Lesions scattered over chest and back  Psychiatric: She has a normal mood and affect. Her behavior is normal. Thought content normal.  Nursing note and vitals reviewed.   BP 136/84 (BP Location: Right Arm, Patient Position: Sitting, Cuff Size: Large)   Pulse 88   Temp 98.4 F (36.9 C)   Ht 5' 4"  (1.626 m)   Wt 288 lb (130.6 kg)   BMI 49.44 kg/m   Assessment and Plan: 1. Folliculitis Local care  2. Sinus congestion Continue allergy medications Listerine gargles for possible tonsilliths  3. Need for diphtheria-tetanus-pertussis (Tdap) vaccine - Tdap vaccine greater than or equal to 7yo IM   Meds ordered this encounter  Medications  . sulfamethoxazole-trimethoprim (BACTRIM DS,SEPTRA DS) 800-160 MG tablet    Sig: Take 1 tablet by mouth 2 (two) times daily.    Dispense:  20 tablet    Refill:  0    Halina Maidens, MD [facility]  04/25/2016

## 2016-04-25 NOTE — Patient Instructions (Signed)
Continue daily Listerine gargles

## 2016-07-16 ENCOUNTER — Telehealth: Payer: Self-pay | Admitting: Internal Medicine

## 2016-07-16 ENCOUNTER — Other Ambulatory Visit: Payer: Self-pay | Admitting: Internal Medicine

## 2016-07-16 DIAGNOSIS — F324 Major depressive disorder, single episode, in partial remission: Secondary | ICD-10-CM

## 2016-07-16 NOTE — Telephone Encounter (Signed)
Called pt on preferred contact # and left a message to call back.

## 2016-07-17 NOTE — Telephone Encounter (Signed)
Finally got in touch with pt and set up appt in December for CPE.

## 2016-11-23 DIAGNOSIS — L0390 Cellulitis, unspecified: Secondary | ICD-10-CM | POA: Diagnosis not present

## 2016-12-23 DIAGNOSIS — H5213 Myopia, bilateral: Secondary | ICD-10-CM | POA: Diagnosis not present

## 2016-12-23 DIAGNOSIS — H52223 Regular astigmatism, bilateral: Secondary | ICD-10-CM | POA: Diagnosis not present

## 2017-01-11 ENCOUNTER — Other Ambulatory Visit: Payer: Self-pay | Admitting: Internal Medicine

## 2017-01-11 DIAGNOSIS — F324 Major depressive disorder, single episode, in partial remission: Secondary | ICD-10-CM

## 2017-01-19 ENCOUNTER — Encounter: Payer: BLUE CROSS/BLUE SHIELD | Admitting: Internal Medicine

## 2017-01-28 ENCOUNTER — Ambulatory Visit (INDEPENDENT_AMBULATORY_CARE_PROVIDER_SITE_OTHER): Payer: BLUE CROSS/BLUE SHIELD | Admitting: Internal Medicine

## 2017-01-28 ENCOUNTER — Encounter: Payer: Self-pay | Admitting: Internal Medicine

## 2017-01-28 VITALS — BP 116/70 | HR 73 | Ht 64.0 in | Wt 287.0 lb

## 2017-01-28 DIAGNOSIS — E034 Atrophy of thyroid (acquired): Secondary | ICD-10-CM | POA: Diagnosis not present

## 2017-01-28 DIAGNOSIS — F324 Major depressive disorder, single episode, in partial remission: Secondary | ICD-10-CM

## 2017-01-28 DIAGNOSIS — Z0000 Encounter for general adult medical examination without abnormal findings: Secondary | ICD-10-CM

## 2017-01-28 DIAGNOSIS — R928 Other abnormal and inconclusive findings on diagnostic imaging of breast: Secondary | ICD-10-CM | POA: Diagnosis not present

## 2017-01-28 DIAGNOSIS — Z1239 Encounter for other screening for malignant neoplasm of breast: Secondary | ICD-10-CM

## 2017-01-28 LAB — POCT URINALYSIS DIPSTICK
Bilirubin, UA: NEGATIVE
Blood, UA: NEGATIVE
Glucose, UA: NEGATIVE
Leukocytes, UA: NEGATIVE
Nitrite, UA: NEGATIVE
Protein, UA: NEGATIVE
Spec Grav, UA: 1.02 (ref 1.010–1.025)
Urobilinogen, UA: 0.2 E.U./dL
pH, UA: 6.5 (ref 5.0–8.0)

## 2017-01-28 MED ORDER — LEVOTHYROXINE SODIUM 75 MCG PO TABS: 75.0000 ug | ORAL_TABLET | Freq: Every day | ORAL | 3 refills | Status: DC

## 2017-01-28 MED ORDER — DULOXETINE HCL 60 MG PO CPEP: 60.0000 mg | ORAL_CAPSULE | Freq: Every day | ORAL | 1 refills | Status: DC

## 2017-01-28 MED ORDER — TRAZODONE HCL 50 MG PO TABS: 50.0000 mg | ORAL_TABLET | Freq: Every day | ORAL | 3 refills | Status: DC

## 2017-01-28 NOTE — Progress Notes (Signed)
Date:  01/28/2017   Name:  Victoria Dixon   DOB:  28-Feb-1975   MRN:  [id number]   Chief Complaint: Annual Exam (Breast Exam and Papsmear. Mom newly diagnosed with diabetes. Would like A1C checked. ) Victoria Dixon is a 41 y.o. female who presents today for her Complete Annual Exam. She feels well. She reports exercising none. She reports she is sleeping poorly due to family issues and social issues. She had borderline mammogram 12/17 -recommended diagnostic and US of the right breast in 6 months but did not get it done (she was very unhappy with her treatment at [facility] - the MD was abrupt and the tech's body language was worrisome).   Thyroid Problem  Presents for follow-up visit. Patient reports no anxiety, constipation, diarrhea, fatigue, palpitations or tremors. The symptoms have been stable.  Depression         This is a chronic problem.  The problem has been waxing and waning since onset.  Associated symptoms include decreased interest.  Associated symptoms include no fatigue and no headaches.     The symptoms are aggravated by family issues.  Past treatments include SNRIs - Serotonin and norepinephrine reuptake inhibitors.  Compliance with treatment is good.  Past medical history includes thyroid problem.   Mother was critically injured in [place] - just returned home but is verbally abusive.  Father not supportive so she moved out and is living with a friend at the moment.   Review of Systems  Constitutional: Negative for chills, fatigue and fever.  HENT: Negative for congestion, hearing loss, tinnitus, trouble swallowing and voice change.   Eyes: Negative for visual disturbance.  Respiratory: Negative for cough, chest tightness, shortness of breath and wheezing.   Cardiovascular: Negative for chest pain, palpitations and leg swelling.  Gastrointestinal: Negative for abdominal pain, constipation, diarrhea and vomiting.  Endocrine: Negative for polydipsia and polyuria.    Genitourinary: Negative for dysuria, frequency, genital sores, vaginal bleeding and vaginal discharge.  Musculoskeletal: Positive for arthralgias (knees). Negative for gait problem and joint swelling.  Skin: Negative for color change and rash.  Neurological: Negative for dizziness, tremors, light-headedness and headaches.  Hematological: Negative for adenopathy. Does not bruise/bleed easily.  Psychiatric/Behavioral: Positive for depression, dysphoric mood (unchanged) and sleep disturbance. The patient is not nervous/anxious.     Patient Active Problem List   Diagnosis Date Noted  . Major depressive disorder with single episode, in partial remission ([place]) 01/01/2016  . Abnormal mammogram 12/28/2015  . BMI 45.0-49.9, adult ([place]) 12/28/2015  . Environmental and seasonal allergies 08/10/2015  . Hypothyroidism due to acquired atrophy of thyroid 08/10/2015  . Vitamin D deficiency 08/10/2015  . Depression 05/04/2015  . Generalized anxiety disorder 05/04/2015    Prior to Admission medications   Medication Sig Start Date End Date Taking? Authorizing Provider  DULoxetine (CYMBALTA) 60 MG capsule TAKE 1 CAPSULE (60 MG TOTAL) BY MOUTH DAILY. 01/11/17  Yes Glean Hess, MD  etonogestrel (NEXPLANON) 68 MG IMPL implant 1 each by Subdermal route once.   Yes [provider]  fexofenadine (ALLEGRA) 180 MG tablet Take 180 mg by mouth daily.   Yes [provider]  levothyroxine (SYNTHROID, LEVOTHROID) 75 MCG tablet Take 1 tablet (75 mcg total) by mouth daily before breakfast. 02/15/16  Yes Glean Hess, MD  OVER THE COUNTER MEDICATION Move Free Ultra- joint supplement.   Yes [provider]  Vitamin D, Ergocalciferol, (DRISDOL) 50000 units CAPS capsule Take 50,000 Units by mouth every  7 (seven) days.   Yes [provider]    Allergies  Allergen Reactions  . Azithromycin Nausea Only    And dizziness    Past Surgical History:  Procedure Laterality Date  .  BILATERAL CARPAL TUNNEL RELEASE Bilateral 2010  . LEEP  2002   For abnormal pap, followed by cryosurgery    Social History   Tobacco Use  . Smoking status: Never Smoker  . Smokeless tobacco: Never Used  Substance Use Topics  . Alcohol use: Yes    Alcohol/week: 0.6 oz    Types: 1 Standard drinks or equivalent per week    Comment: Rarely  . Drug use: No     Medication list has been reviewed and updated.  PHQ 2/9 Scores 01/28/2017 08/10/2015 05/08/2015  PHQ - 2 Score 2 0 0  PHQ- 9 Score 7 - 1    Physical Exam  Constitutional: She is oriented to person, place, and time. She appears well-developed and well-nourished. No distress.  HENT:  Head: Normocephalic and atraumatic.  Right Ear: Tympanic membrane and ear canal normal.  Left Ear: Tympanic membrane and ear canal normal.  Nose: Right sinus exhibits no maxillary sinus tenderness. Left sinus exhibits no maxillary sinus tenderness.  Mouth/Throat: Uvula is midline and oropharynx is clear and moist.  Eyes: Conjunctivae and EOM are normal. Right eye exhibits no discharge. Left eye exhibits no discharge. No scleral icterus.  Neck: Normal range of motion. Carotid bruit is not present. No erythema present. No thyromegaly present.  Cardiovascular: Normal rate, regular rhythm, normal heart sounds and normal pulses.  Pulmonary/Chest: Effort normal. No respiratory distress. She has no wheezes. Right breast exhibits no mass, no nipple discharge, no skin change and no tenderness. Left breast exhibits no mass, no nipple discharge, no skin change and no tenderness.  Abdominal: Soft. Bowel sounds are normal. There is no hepatosplenomegaly. There is no tenderness. There is no CVA tenderness.  Musculoskeletal:       Right knee: She exhibits decreased range of motion. She exhibits no swelling and no effusion.       Left knee: She exhibits decreased range of motion. She exhibits no swelling and no effusion.  Lymphadenopathy:    She has no cervical  adenopathy.    She has no axillary adenopathy.  Neurological: She is alert and oriented to person, place, and time. She has normal reflexes. No cranial nerve deficit or sensory deficit.  Skin: Skin is warm, dry and intact. No rash noted.  Psychiatric: She has a normal mood and affect. Her speech is normal and behavior is normal. Thought content normal.  Nursing note and vitals reviewed.   BP 116/70   Pulse 73   Ht 5' 4"  (1.626 m)   Wt 287 lb (130.2 kg)   SpO2 99%   BMI 49.26 kg/m   Assessment and Plan: 1. Annual physical exam Defer pap to next year - CBC with Differential/Platelet - Comprehensive metabolic panel - Hemoglobin A1c - Lipid panel - POCT urinalysis dipstick  2. Breast cancer screening Encouraged breast self awareness Will not get routine mammograms per pt preference  3. Hypothyroidism due to acquired atrophy of thyroid supplemented - TSH - levothyroxine (SYNTHROID, LEVOTHROID) 75 MCG tablet; Take 1 tablet (75 mcg total) by mouth daily before breakfast.  Dispense: 90 tablet; Refill: 3  4. Major depressive disorder with single episode, in partial remission ([facility]) Add trazodone at night to aid with sleep - traZODone (DESYREL) 50 MG tablet; Take 1 tablet (50 mg  total) by mouth at bedtime.  Dispense: 90 tablet; Refill: 3 - DULoxetine (CYMBALTA) 60 MG capsule; Take 1 capsule (60 mg total) by mouth daily.  Dispense: 90 capsule; Refill: 1  5. Abnormal mammogram As above - breast exam normal   Meds ordered this encounter  Medications  . traZODone (DESYREL) 50 MG tablet    Sig: Take 1 tablet (50 mg total) by mouth at bedtime.    Dispense:  90 tablet    Refill:  3  . levothyroxine (SYNTHROID, LEVOTHROID) 75 MCG tablet    Sig: Take 1 tablet (75 mcg total) by mouth daily before breakfast.    Dispense:  90 tablet    Refill:  3  . DULoxetine (CYMBALTA) 60 MG capsule    Sig: Take 1 capsule (60 mg total) by mouth daily.    Dispense:  90 capsule    Refill:  1     Partially dictated using Editor, commissioning. Any errors are unintentional.  Halina Maidens, MD [facility]  01/28/2017

## 2017-01-28 NOTE — Patient Instructions (Signed)

## 2017-01-29 LAB — COMPREHENSIVE METABOLIC PANEL
ALT: 23 IU/L (ref 0–32)
AST: 21 IU/L (ref 0–40)
Albumin/Globulin Ratio: 1.4 (ref 1.2–2.2)
Albumin: 4 g/dL (ref 3.5–5.5)
Alkaline Phosphatase: 88 IU/L (ref 39–117)
BUN/Creatinine Ratio: 13 (ref 9–23)
BUN: 10 mg/dL (ref 6–24)
Bilirubin Total: 0.6 mg/dL (ref 0.0–1.2)
CO2: 25 mmol/L (ref 20–29)
Calcium: 9.4 mg/dL (ref 8.7–10.2)
Chloride: 104 mmol/L (ref 96–106)
Creatinine, Ser: 0.75 mg/dL (ref 0.57–1.00)
GFR calc Af Amer: 114 mL/min/{1.73_m2} (ref >59)
GFR calc non Af Amer: 99 mL/min/{1.73_m2} (ref >59)
Globulin, Total: 2.9 g/dL (ref 1.5–4.5)
Glucose: 79 mg/dL (ref 65–99)
Potassium: 4.5 mmol/L (ref 3.5–5.2)
Sodium: 142 mmol/L (ref 134–144)
Total Protein: 6.9 g/dL (ref 6.0–8.5)

## 2017-01-29 LAB — LIPID PANEL
Chol/HDL Ratio: 3.7 ratio (ref 0.0–4.4)
Cholesterol, Total: 165 mg/dL (ref 100–199)
HDL: 45 mg/dL (ref >39)
LDL Calculated: 102 mg/dL — ABNORMAL HIGH (ref 0–99)
Triglycerides: 91 mg/dL (ref 0–149)
VLDL Cholesterol Cal: 18 mg/dL (ref 5–40)

## 2017-01-29 LAB — TSH: TSH: 0.828 u[IU]/mL (ref 0.450–4.500)

## 2017-01-29 LAB — HEMOGLOBIN A1C
Est. average glucose Bld gHb Est-mCnc: 126 mg/dL
Hgb A1c MFr Bld: 6 % — ABNORMAL HIGH (ref 4.8–5.6)

## 2017-01-29 LAB — CBC WITH DIFFERENTIAL/PLATELET
Basophils Absolute: 0 10*3/uL (ref 0.0–0.2)
Basos: 0 %
EOS (ABSOLUTE): 0.2 10*3/uL (ref 0.0–0.4)
Eos: 4 %
Hematocrit: 40.9 % (ref 34.0–46.6)
Hemoglobin: 14 g/dL (ref 11.1–15.9)
Immature Grans (Abs): 0 10*3/uL (ref 0.0–0.1)
Immature Granulocytes: 0 %
Lymphocytes Absolute: 2 10*3/uL (ref 0.7–3.1)
Lymphs: 33 %
MCH: 30.9 pg (ref 26.6–33.0)
MCHC: 34.2 g/dL (ref 31.5–35.7)
MCV: 90 fL (ref 79–97)
Monocytes Absolute: 0.3 10*3/uL (ref 0.1–0.9)
Monocytes: 5 %
Neutrophils Absolute: 3.5 10*3/uL (ref 1.4–7.0)
Neutrophils: 58 %
Platelets: 240 10*3/uL (ref 150–379)
RBC: 4.53 x10E6/uL (ref 3.77–5.28)
RDW: 13.4 % (ref 12.3–15.4)
WBC: 6 10*3/uL (ref 3.4–10.8)

## 2017-04-21 ENCOUNTER — Encounter: Payer: Self-pay | Admitting: Internal Medicine

## 2017-05-05 ENCOUNTER — Ambulatory Visit
Admission: RE | Admit: 2017-05-05 | Discharge: 2017-05-05 | Disposition: A | Discharge status: Home / Self Care | Payer: BLUE CROSS/BLUE SHIELD | Source: Ambulatory Visit | Attending: Internal Medicine | Admitting: Internal Medicine

## 2017-05-05 ENCOUNTER — Ambulatory Visit: Payer: BLUE CROSS/BLUE SHIELD | Admitting: Internal Medicine

## 2017-05-05 ENCOUNTER — Encounter: Payer: Self-pay | Admitting: Internal Medicine

## 2017-05-05 VITALS — BP 112/78 | HR 76 | Ht 64.0 in | Wt 291.0 lb

## 2017-05-05 DIAGNOSIS — M25562 Pain in left knee: Secondary | ICD-10-CM | POA: Insufficient documentation

## 2017-05-05 DIAGNOSIS — R51 Headache: Secondary | ICD-10-CM

## 2017-05-05 DIAGNOSIS — G8929 Other chronic pain: Secondary | ICD-10-CM | POA: Insufficient documentation

## 2017-05-05 DIAGNOSIS — M25561 Pain in right knee: Secondary | ICD-10-CM | POA: Diagnosis not present

## 2017-05-05 DIAGNOSIS — R519 Headache, unspecified: Secondary | ICD-10-CM

## 2017-05-05 DIAGNOSIS — G43809 Other migraine, not intractable, without status migrainosus: Secondary | ICD-10-CM | POA: Insufficient documentation

## 2017-05-05 NOTE — Progress Notes (Signed)
Patient informed of normal joint spacing. Wants to wait for referral until headaches are resolved with Neuro first (due to money and and insurance coverage for specialists). Will call if knees become worse for referral.

## 2017-05-05 NOTE — Progress Notes (Signed)
Date:  05/05/2017   Name:  Victoria Dixon   DOB:  [birth year]   MRN:  [id number]   Chief Complaint: Headache (Had whole life and otc meds do not help. In 2011, was diagnosed with Psudeotumor Cerebri with papilledema.) and Knee Pain (Both knees- mentioned last visit. Not going away. Joint supplement not helping. Constant pain. Aching pains. Activity makes pain worse.)  Knee Pain   There was no injury mechanism. The pain is present in the left knee and right knee. The quality of the pain is described as burning and aching. The pain is moderate. The pain has been worsening since onset. Pertinent negatives include no numbness. She has tried NSAIDs (and glucosamine) for the symptoms.  Headache   This is a recurrent problem. The current episode started more than 1 year ago. The problem occurs daily. The problem has been unchanged. The pain is located in the bilateral, temporal and vertex region. The pain does not radiate. Associated symptoms include a visual change. Pertinent negatives include no dizziness, fever, nausea, numbness, phonophobia, photophobia, vomiting or weakness. Her past medical history is significant for pseudotumor cerebri. (Was told in 2011 that she had this while in [place] - took diamox for several months )  She had an MRI that was normal at that time.   Review of Systems  Constitutional: Negative for chills, fatigue and fever.  HENT: Negative for trouble swallowing.   Eyes: Positive for visual disturbance. Negative for photophobia.  Respiratory: Negative for chest tightness and shortness of breath.   Cardiovascular: Negative for chest pain and palpitations.  Gastrointestinal: Negative for nausea and vomiting.  Musculoskeletal: Positive for arthralgias and gait problem.  Neurological: Positive for headaches. Negative for dizziness, weakness and numbness.    Patient Active Problem List   Diagnosis Date Noted  . Major depressive disorder with single episode, in partial  remission ([place]) 01/01/2016  . Abnormal mammogram 12/28/2015  . BMI 45.0-49.9, adult ([place]) 12/28/2015  . Environmental and seasonal allergies 08/10/2015  . Hypothyroidism due to acquired atrophy of thyroid 08/10/2015  . Vitamin D deficiency 08/10/2015  . Depression 05/04/2015  . Generalized anxiety disorder 05/04/2015    Prior to Admission medications   Medication Sig Start Date End Date Taking? Authorizing Provider  DULoxetine (CYMBALTA) 60 MG capsule Take 1 capsule (60 mg total) by mouth daily. 01/28/17  Yes Glean Hess, MD  etonogestrel (NEXPLANON) 68 MG IMPL implant 1 each by Subdermal route once.   Yes [provider]  fexofenadine (ALLEGRA) 180 MG tablet Take 180 mg by mouth daily.   Yes [provider]  levothyroxine (SYNTHROID, LEVOTHROID) 75 MCG tablet Take 1 tablet (75 mcg total) by mouth daily before breakfast. 01/28/17  Yes Glean Hess, MD  OVER THE COUNTER MEDICATION Move Free Ultra- joint supplement.   Yes [provider]  traZODone (DESYREL) 50 MG tablet Take 1 tablet (50 mg total) by mouth at bedtime. 01/28/17  Yes Glean Hess, MD  Vitamin D, Ergocalciferol, (DRISDOL) 50000 units CAPS capsule Take 50,000 Units by mouth every 7 (seven) days.   Yes [provider]    Allergies  Allergen Reactions  . Azithromycin Nausea Only    And dizziness    Past Surgical History:  Procedure Laterality Date  . BILATERAL CARPAL TUNNEL RELEASE Bilateral 2010  . LEEP  2002   For abnormal pap, followed by cryosurgery    Social History   Tobacco Use  . Smoking status: Never Smoker  .  Smokeless tobacco: Never Used  Substance Use Topics  . Alcohol use: Yes    Alcohol/week: 0.6 oz    Types: 1 Standard drinks or equivalent per week    Comment: Rarely  . Drug use: No     Medication list has been reviewed and updated.  PHQ 2/9 Scores 05/05/2017 01/28/2017 08/10/2015 05/08/2015  PHQ - 2 Score 6 2 0 0  PHQ- 9 Score 15 7 - 1     Physical Exam  Constitutional: She is oriented to person, place, and time. She appears well-developed. No distress.  HENT:  Head: Normocephalic and atraumatic.  Cardiovascular: Normal rate, regular rhythm and normal heart sounds.  Pulmonary/Chest: Effort normal and breath sounds normal. No respiratory distress. She has no wheezes.  Musculoskeletal:       Right knee: She exhibits no swelling and no effusion.       Left knee: She exhibits no swelling and no effusion.  Neurological: She is alert and oriented to person, place, and time. She has normal reflexes. No sensory deficit. Gait normal.  Skin: Skin is warm and dry. No rash noted.  Psychiatric: She has a normal mood and affect. Her behavior is normal. Thought content normal.  Nursing note and vitals reviewed.   BP 112/78   Pulse 76   Ht 5' 4"  (1.626 m)   Wt 291 lb (132 kg)   SpO2 99%   BMI 49.95 kg/m   Assessment and Plan: 1. Chronic nonintractable headache, unspecified headache type Hx of PTC - refer to neurology Will not start diamox empirically due to hx of side effects - Ambulatory referral to Neurology  2. Chronic pain of both knees Continue advil, glucosamine and add Tumeric - DG Knee 1-2 Views Right; Future   No orders of the defined types were placed in this encounter.   Partially dictated using Editor, commissioning. Any errors are unintentional.  Halina Maidens, MD [facility]  05/05/2017

## 2017-05-08 ENCOUNTER — Encounter: Payer: Self-pay | Admitting: Internal Medicine

## 2017-05-08 NOTE — Telephone Encounter (Signed)
Patient requesting medications until able to be seen by Dr Manuella Ghazi. Please Advise?

## 2017-05-30 ENCOUNTER — Emergency Department
Admission: EM | Admit: 2017-05-30 | Discharge: 2017-05-30 | Disposition: A | Discharge status: Home / Self Care | Payer: BLUE CROSS/BLUE SHIELD | Attending: Emergency Medicine | Admitting: Emergency Medicine

## 2017-05-30 ENCOUNTER — Other Ambulatory Visit: Payer: Self-pay

## 2017-05-30 DIAGNOSIS — Z79899 Other long term (current) drug therapy: Secondary | ICD-10-CM | POA: Insufficient documentation

## 2017-05-30 DIAGNOSIS — Y998 Other external cause status: Secondary | ICD-10-CM | POA: Diagnosis not present

## 2017-05-30 DIAGNOSIS — E039 Hypothyroidism, unspecified: Secondary | ICD-10-CM | POA: Insufficient documentation

## 2017-05-30 DIAGNOSIS — S81832A Puncture wound without foreign body, left lower leg, initial encounter: Secondary | ICD-10-CM | POA: Insufficient documentation

## 2017-05-30 DIAGNOSIS — S81852A Open bite, left lower leg, initial encounter: Secondary | ICD-10-CM | POA: Diagnosis not present

## 2017-05-30 DIAGNOSIS — W540XXA Bitten by dog, initial encounter: Secondary | ICD-10-CM | POA: Diagnosis not present

## 2017-05-30 DIAGNOSIS — Y939 Activity, unspecified: Secondary | ICD-10-CM | POA: Diagnosis not present

## 2017-05-30 DIAGNOSIS — S81812A Laceration without foreign body, left lower leg, initial encounter: Secondary | ICD-10-CM | POA: Diagnosis not present

## 2017-05-30 DIAGNOSIS — Y929 Unspecified place or not applicable: Secondary | ICD-10-CM | POA: Diagnosis not present

## 2017-05-30 MED ORDER — AMOXICILLIN-POT CLAVULANATE 875-125 MG PO TABS: 1.0000 | ORAL_TABLET | Freq: Two times a day (BID) | ORAL | 0 refills | Status: DC

## 2017-05-30 MED ORDER — AMOXICILLIN-POT CLAVULANATE 875-125 MG PO TABS
1.0000 | ORAL_TABLET | Freq: Once | ORAL | Status: AC
  Administered 2017-05-30: 1 via ORAL
  Filled 2017-05-30: qty 1

## 2017-05-30 MED ORDER — TETANUS-DIPHTH-ACELL PERTUSSIS 5-2.5-18.5 LF-MCG/0.5 IM SUSP
0.5000 mL | Freq: Once | INTRAMUSCULAR | Status: AC
  Administered 2017-05-30: 0.5 mL via INTRAMUSCULAR
  Filled 2017-05-30: qty 0.5

## 2017-05-30 MED ORDER — MELOXICAM 15 MG PO TABS: 15.0000 mg | ORAL_TABLET | Freq: Every day | ORAL | 0 refills | Status: DC

## 2017-05-30 NOTE — ED Notes (Signed)
Pt has couple puncture wounds noted to left lower leg. Bleeding noted to area and little swelling

## 2017-05-30 NOTE — ED Notes (Signed)
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

## 2017-05-30 NOTE — ED Provider Notes (Signed)
[facility] Emergency Department Provider Note  ____________________________________________  Time seen: Approximately 8:20 PM  I have reviewed the triage vital signs and the nursing notes.   HISTORY  Chief Complaint Animal Bite    HPI Victoria Dixon is a 42 y.o. female who presents the emergency department complaining of dog bite to the left lower extremity.  Patient reports that she was bitten by a dog that was familiar to herself.  Patient did not want to provide information when the animal and she did not want animal control involved.  Patient reports that she is very familiar with the dog, and there was an accidental tripping over the animal causing the animal to "" in her.  The animal was a larger dog and did not make contact and did break the skin.  Patient reports that she needs a tetanus shot at this time.  She denies any other injury or complaint.  The animal is up-to-date on its rabies vaccine with no indication of abnormal behavior according to the patient.  Past Medical History:  Diagnosis Date  . Abnormal Pap smear of cervix 2002   LEEP procedure followed by cryosurgery  . Allergy    Eyes, nose symptoms  . Anxiety 2016  . Birth control   . Depression    1998  . Thyroid disease 2007   Hypothyroidism    Patient Active Problem List   Diagnosis Date Noted  . Chronic nonintractable headache 05/05/2017  . Chronic pain of both knees 05/05/2017  . Major depressive disorder with single episode, in partial remission ([place]) 01/01/2016  . Abnormal mammogram 12/28/2015  . BMI 45.0-49.9, adult ([place]) 12/28/2015  . Environmental and seasonal allergies 08/10/2015  . Hypothyroidism due to acquired atrophy of thyroid 08/10/2015  . Vitamin D deficiency 08/10/2015  . Depression 05/04/2015  . Generalized anxiety disorder 05/04/2015    Past Surgical History:  Procedure Laterality Date  . BILATERAL CARPAL TUNNEL RELEASE Bilateral 2010  . LEEP  2002   For abnormal pap, followed by cryosurgery    Prior to Admission medications   Medication Sig Start Date End Date Taking? Authorizing Provider  amoxicillin-clavulanate (AUGMENTIN) 875-125 MG tablet Take 1 tablet by mouth 2 (two) times daily. 05/30/17   Sheila Gervasi, Charline Bills, PA-C  DULoxetine (CYMBALTA) 60 MG capsule Take 1 capsule (60 mg total) by mouth daily. 01/28/17   Glean Hess, MD  etonogestrel (NEXPLANON) 68 MG IMPL implant 1 each by Subdermal route once.    [provider]  fexofenadine (ALLEGRA) 180 MG tablet Take 180 mg by mouth daily.    [provider]  levothyroxine (SYNTHROID, LEVOTHROID) 75 MCG tablet Take 1 tablet (75 mcg total) by mouth daily before breakfast. 01/28/17   Glean Hess, MD  meloxicam (MOBIC) 15 MG tablet Take 1 tablet (15 mg total) by mouth daily. 05/30/17   Elleana Stillson, Charline Bills, PA-C  OVER THE COUNTER MEDICATION Move Free Ultra- joint supplement.    [provider]  traZODone (DESYREL) 50 MG tablet Take 1 tablet (50 mg total) by mouth at bedtime. 01/28/17   Glean Hess, MD  Vitamin D, Ergocalciferol, (DRISDOL) 50000 units CAPS capsule Take 50,000 Units by mouth every 7 (seven) days.    [provider]    Allergies Erythromycin and Azithromycin  Family History  Problem Relation Age of Onset  . Arthritis Mother        Mixed Connective Tissue Disease/Sjogrens  . Hypertension Mother   . Depression Mother   .  Anxiety disorder Mother   . Diabetes Mother   . Prostate cancer Father   . Bradycardia Father   . Heart disease Sister        2 MIs  . Eczema Sister   . Mental illness Sister   . Irritable bowel syndrome Sister   . Diabetes Maternal Grandmother     Social History Social History   Tobacco Use  . Smoking status: Never Smoker  . Smokeless tobacco: Never Used  Substance Use Topics  . Alcohol use: Yes    Alcohol/week: 0.6 oz    Types: 1 Standard drinks or equivalent per week    Comment:  Rarely  . Drug use: No     Review of Systems  Constitutional: No fever/chills Eyes: No visual changes.  Cardiovascular: no chest pain. Respiratory: no cough. No SOB. Gastrointestinal: No abdominal pain.  No nausea, no vomiting.  Musculoskeletal: Negative for musculoskeletal pain. Skin: Positive for dog bite to the left lower extremity Neurological: Negative for headaches, focal weakness or numbness. 10-point ROS otherwise negative.  ____________________________________________   PHYSICAL EXAM:  VITAL SIGNS: ED Triage Vitals  Enc Vitals Group     BP 05/30/17 1915 (!) 154/86     Pulse Rate 05/30/17 1915 (!) 111     Resp 05/30/17 1915 16     Temp 05/30/17 1915 99.1 F (37.3 C)     Temp Source 05/30/17 1915 Oral     SpO2 05/30/17 1915 100 %     Weight 05/30/17 1916 290 lb (131.5 kg)     Height 05/30/17 1916 5' 4"  (1.626 m)     Head Circumference --      Peak Flow --      Pain Score 05/30/17 1915 0     Pain Loc --      Pain Edu? --      Excl. in [place]? --      Constitutional: Alert and oriented. Well appearing and in no acute distress. Eyes: Conjunctivae are normal. PERRL. EOMI. Head: Atraumatic. Neck: No stridor.    Cardiovascular: Normal rate, regular rhythm. Normal S1 and S2.  Good peripheral circulation. Respiratory: Normal respiratory effort without tachypnea or retractions. Lungs CTAB. Good air entry to the bases with no decreased or absent breath sounds. Musculoskeletal: Full range of motion to all extremities. No gross deformities appreciated.  4 puncture wounds noted to the lateral and posterior aspect of the distal left lower extremity.  Minor bleeding after wound is undressed.  No visible foreign body.  No linear lacerations.  No erythema or edema consistent with infection. Neurologic:  Normal speech and language. No gross focal neurologic deficits are appreciated.  Skin:  Skin is warm, dry and intact. No rash noted. Psychiatric: Mood and affect are normal.  Speech and behavior are normal. Patient exhibits appropriate insight and judgement.   ____________________________________________   LABS (all labs ordered are listed, but only abnormal results are displayed)  Labs Reviewed - No data to display ____________________________________________  EKG   ____________________________________________  RADIOLOGY   No results found.  ____________________________________________    PROCEDURES  Procedure(s) performed:    Marland KitchenMarland KitchenLaceration Repair Date/Time: 05/30/2017 8:43 PM Performed by: Darletta Moll, PA-C Authorized by: Darletta Moll, PA-C   Consent:    Consent obtained:  Verbal   Consent given by:  Patient   Risks discussed:  Infection Anesthesia (see MAR for exact dosages):    Anesthesia method:  None Laceration details:    Location:  Leg   Leg  location:  L lower leg Repair type:    Repair type:  Simple Exploration:    Hemostasis achieved with:  Direct pressure   Wound extent: no foreign bodies/material noted, no muscle damage noted, no nerve damage noted, no tendon damage noted, no underlying fracture noted and no vascular damage noted     Contaminated: no   Treatment:    Area cleansed with:  Shur-Clens   Amount of cleaning:  Extensive Post-procedure details:    Dressing:  Non-adherent dressing, sterile dressing and tube gauze   Patient tolerance of procedure:  Tolerated well, no immediate complications Comments:     For puncture wounds to the left lower extremity were thoroughly cleansed, irrigated.  Wound was dressed using nonadherent dressing, sterile gauze and Ace bandage over wound.  No attempts at closure due to nature of injury.      Medications  Tdap (BOOSTRIX) injection 0.5 mL (has no administration in time range)  amoxicillin-clavulanate (AUGMENTIN) 875-125 MG per tablet 1 tablet (1 tablet Oral Given 05/30/17 2042)     ____________________________________________   INITIAL IMPRESSION /  ASSESSMENT AND PLAN / ED COURSE  Pertinent labs & imaging results that were available during my care of the patient were reviewed by me and considered in my medical decision making (see chart for details).  Review of the [place] CSRS was performed in accordance of the [place] prior to dispensing any controlled drugs.     Patient's diagnosis is consistent with dog bite to the left lower extremity.  Patient presents to the emergency department after accidentally tripping over a dog with a dog reaction of biting her in the lower leg.  Patient is familiar with the dog but does not want to give information regarding the dog's whereabouts.  The patient states that the dog is up to date on all immunizations and was not acting abnormally.  She declines rabies vaccines at this time.  Tetanus shot is updated.  Wound is thoroughly cleansed and dressed in the emergency department.. Patient will be discharged home with prescriptions for Augmentin prophylactically. Patient is to follow up with primary care as needed or otherwise directed. Patient is given ED precautions to return to the ED for any worsening or new symptoms.     ____________________________________________  FINAL CLINICAL IMPRESSION(S) / ED DIAGNOSES  Final diagnoses:  Dog bite, initial encounter      NEW MEDICATIONS STARTED DURING THIS VISIT:  ED Discharge Orders        Ordered    amoxicillin-clavulanate (AUGMENTIN) 875-125 MG tablet  2 times daily     05/30/17 2040    meloxicam (MOBIC) 15 MG tablet  Daily     05/30/17 2040          This chart was dictated using voice recognition software/Dragon. Despite best efforts to proofread, errors can occur which can change the meaning. Any change was purely unintentional.    Darletta Moll, PA-C 05/30/17 2046    Nena Polio, MD 06/04/17 1314

## 2017-05-30 NOTE — ED Triage Notes (Signed)
Pt states was bitten by a stray dog approx 2 hours pta. Pt with wound to left lower lateral leg. Pt unwilling to provide more information in triage. Pt states she did not contact animal control.

## 2017-05-30 NOTE — ED Notes (Signed)
Per officer barker with [employer], animal control has already spoken to pt via him.

## 2017-06-19 DIAGNOSIS — E559 Vitamin D deficiency, unspecified: Secondary | ICD-10-CM | POA: Diagnosis not present

## 2017-06-19 DIAGNOSIS — G4719 Other hypersomnia: Secondary | ICD-10-CM | POA: Diagnosis not present

## 2017-06-19 DIAGNOSIS — G43709 Chronic migraine without aura, not intractable, without status migrainosus: Secondary | ICD-10-CM | POA: Diagnosis not present

## 2017-06-19 DIAGNOSIS — E538 Deficiency of other specified B group vitamins: Secondary | ICD-10-CM | POA: Diagnosis not present

## 2017-06-22 ENCOUNTER — Other Ambulatory Visit: Payer: Self-pay | Admitting: Neurology

## 2017-06-22 DIAGNOSIS — G43709 Chronic migraine without aura, not intractable, without status migrainosus: Secondary | ICD-10-CM

## 2017-07-01 DIAGNOSIS — E538 Deficiency of other specified B group vitamins: Secondary | ICD-10-CM | POA: Insufficient documentation

## 2017-07-02 ENCOUNTER — Ambulatory Visit
Admission: RE | Admit: 2017-07-02 | Discharge: 2017-07-02 | Disposition: A | Discharge status: Home / Self Care | Payer: BLUE CROSS/BLUE SHIELD | Source: Ambulatory Visit | Attending: Neurology | Admitting: Neurology

## 2017-07-02 DIAGNOSIS — J320 Chronic maxillary sinusitis: Secondary | ICD-10-CM | POA: Diagnosis not present

## 2017-07-02 DIAGNOSIS — G43709 Chronic migraine without aura, not intractable, without status migrainosus: Secondary | ICD-10-CM | POA: Insufficient documentation

## 2017-07-02 DIAGNOSIS — G932 Benign intracranial hypertension: Secondary | ICD-10-CM | POA: Insufficient documentation

## 2017-07-02 DIAGNOSIS — E236 Other disorders of pituitary gland: Secondary | ICD-10-CM | POA: Diagnosis not present

## 2017-07-02 MED ORDER — GADOBENATE DIMEGLUMINE 529 MG/ML IV SOLN
20.0000 mL | Freq: Once | INTRAVENOUS | Status: AC | PRN
  Administered 2017-07-02: 20 mL via INTRAVENOUS

## 2017-07-29 ENCOUNTER — Ambulatory Visit: Payer: BLUE CROSS/BLUE SHIELD | Admitting: Internal Medicine

## 2017-07-29 DIAGNOSIS — G4733 Obstructive sleep apnea (adult) (pediatric): Secondary | ICD-10-CM | POA: Diagnosis not present

## 2017-07-31 ENCOUNTER — Encounter: Payer: Self-pay | Admitting: Internal Medicine

## 2017-07-31 ENCOUNTER — Ambulatory Visit: Payer: BLUE CROSS/BLUE SHIELD | Admitting: Internal Medicine

## 2017-07-31 VITALS — BP 130/86 | HR 86 | Temp 98.7°F | Resp 16 | Ht 64.0 in | Wt 292.0 lb

## 2017-07-31 DIAGNOSIS — E034 Atrophy of thyroid (acquired): Secondary | ICD-10-CM

## 2017-07-31 DIAGNOSIS — R7303 Prediabetes: Secondary | ICD-10-CM | POA: Diagnosis not present

## 2017-07-31 DIAGNOSIS — F324 Major depressive disorder, single episode, in partial remission: Secondary | ICD-10-CM

## 2017-07-31 NOTE — Progress Notes (Signed)
Date:  07/31/2017   Name:  Victoria Dixon   DOB:  1975-10-14   MRN:  [id number]   Chief Complaint: Hypothyroidism and Depression Depression         This is a chronic problem.  The problem occurs daily.  Associated symptoms include insomnia, irritable, decreased interest and sad.  Associated symptoms include no suicidal ideas.     The symptoms are aggravated by nothing.  Past treatments include SNRIs - Serotonin and norepinephrine reuptake inhibitors, other medications and SSRIs - Selective serotonin reuptake inhibitors.  Compliance with treatment is good.  Previous treatment provided mild relief.  Past medical history includes thyroid problem.   Thyroid Problem  Presents for follow-up visit. The symptoms have been stable.  Pre-diabetes - borderline elevated A1C.  Weight has remained stable.  Lab Results  Component Value Date   TSH 0.828 01/28/2017   Lab Results  Component Value Date   HGBA1C 6.0 (H) 01/28/2017       Review of Systems  Psychiatric/Behavioral: Positive for depression. Negative for suicidal ideas. The patient has insomnia.     Patient Active Problem List   Diagnosis Date Noted  . Chronic nonintractable headache 05/05/2017  . Chronic pain of both knees 05/05/2017  . Major depressive disorder with single episode, in partial remission ([place]) 01/01/2016  . Abnormal mammogram 12/28/2015  . BMI 45.0-49.9, adult ([place]) 12/28/2015  . Environmental and seasonal allergies 08/10/2015  . Hypothyroidism due to acquired atrophy of thyroid 08/10/2015  . Vitamin D deficiency 08/10/2015  . Depression 05/04/2015  . Generalized anxiety disorder 05/04/2015    Prior to Admission medications   Medication Sig Start Date End Date Taking? Authorizing Provider  DULoxetine (CYMBALTA) 60 MG capsule Take 1 capsule (60 mg total) by mouth daily. 01/28/17  Yes Glean Hess, MD  etonogestrel (NEXPLANON) 68 MG IMPL implant 1 each by Subdermal route once.   Yes [provider]  fexofenadine (ALLEGRA) 180 MG tablet Take 180 mg by mouth daily.   Yes [provider]  levothyroxine (SYNTHROID, LEVOTHROID) 75 MCG tablet Take 1 tablet (75 mcg total) by mouth daily before breakfast. 01/28/17  Yes Glean Hess, MD  topiramate (TOPAMAX) 25 MG tablet Take by mouth. 06/19/17  Yes [provider]  traZODone (DESYREL) 50 MG tablet Take 1 tablet (50 mg total) by mouth at bedtime. 01/28/17  Yes Glean Hess, MD  TURMERIC CURCUMIN PO  06/03/17  Yes [provider]  VITAMIN A PO  07/04/17  Yes [provider]  Vitamin D, Ergocalciferol, (DRISDOL) 50000 units CAPS capsule Take 50,000 Units by mouth every 7 (seven) days.   Yes [provider]    Allergies  Allergen Reactions  . Erythromycin   . Azithromycin Nausea Only    And dizziness    Past Surgical History:  Procedure Laterality Date  . BILATERAL CARPAL TUNNEL RELEASE Bilateral 2010  . LEEP  2002   For abnormal pap, followed by cryosurgery    Social History   Tobacco Use  . Smoking status: Never Smoker  . Smokeless tobacco: Never Used  Substance Use Topics  . Alcohol use: Yes    Alcohol/week: 0.6 oz    Types: 1 Standard drinks or equivalent per week    Comment: Rarely  . Drug use: No     Medication list has been reviewed and updated.  Current Meds  Medication Sig  . DULoxetine (CYMBALTA) 60 MG capsule Take 1 capsule (60 mg total) by mouth  daily.  . etonogestrel (NEXPLANON) 68 MG IMPL implant 1 each by Subdermal route once.  . fexofenadine (ALLEGRA) 180 MG tablet Take 180 mg by mouth daily.  Marland Kitchen levothyroxine (SYNTHROID, LEVOTHROID) 75 MCG tablet Take 1 tablet (75 mcg total) by mouth daily before breakfast.  . topiramate (TOPAMAX) 25 MG tablet Take by mouth.  . traZODone (DESYREL) 50 MG tablet Take 1 tablet (50 mg total) by mouth at bedtime.  . TURMERIC CURCUMIN PO   . VITAMIN A PO   . Vitamin D, Ergocalciferol, (DRISDOL) 50000 units CAPS capsule Take  50,000 Units by mouth every 7 (seven) days.    PHQ 2/9 Scores 07/31/2017 05/05/2017 01/28/2017 08/10/2015  PHQ - 2 Score 4 6 2  0  PHQ- 9 Score 13 15 7  -    Physical Exam  Constitutional: She is oriented to person, place, and time. She appears well-developed and well-nourished. She is irritable.  Neck: Trachea normal and normal range of motion. Neck supple. No thyroid mass present.  Cardiovascular: Normal rate, regular rhythm and normal heart sounds.  Pulmonary/Chest: Effort normal and breath sounds normal.  Neurological: She is alert and oriented to person, place, and time.  Psychiatric: Her speech is normal and behavior is normal. Her mood appears not anxious. Cognition and memory are normal. She exhibits a depressed mood. She expresses no suicidal ideation. She expresses no suicidal plans.    BP 130/86   Pulse 86   Temp 98.7 F (37.1 C) (Oral)   Resp 16   Ht 5' 4"  (1.626 m)   Wt 292 lb (132.5 kg)   SpO2 97%   BMI 50.12 kg/m   Assessment and Plan: 1. Hypothyroidism due to acquired atrophy of thyroid supplemented - TSH  2. Major depressive disorder with single episode, in partial remission (HCC) Stop Cymbalta Begin Trintellix 5 mg daily x 7 days then 10 mg daily (samples) Follow up 3 weeks  3. Pre-diabetes Check labs Continue to work on diet - Hemoglobin A1c   No orders of the defined types were placed in this encounter.   Partially dictated using Editor, commissioning. Any errors are unintentional.  Halina Maidens, MD [facility]  07/31/2017   There are no diagnoses linked to this encounter.

## 2017-08-01 LAB — HEMOGLOBIN A1C
Est. average glucose Bld gHb Est-mCnc: 120 mg/dL
Hgb A1c MFr Bld: 5.8 % — ABNORMAL HIGH (ref 4.8–5.6)

## 2017-08-01 LAB — TSH: TSH: 0.566 u[IU]/mL (ref 0.450–4.500)

## 2017-08-14 DIAGNOSIS — Z3046 Encounter for surveillance of implantable subdermal contraceptive: Secondary | ICD-10-CM | POA: Diagnosis not present

## 2017-08-21 ENCOUNTER — Ambulatory Visit: Payer: BLUE CROSS/BLUE SHIELD | Admitting: Internal Medicine

## 2017-08-21 ENCOUNTER — Encounter: Payer: Self-pay | Admitting: Internal Medicine

## 2017-08-21 ENCOUNTER — Telehealth: Payer: Self-pay

## 2017-08-21 VITALS — BP 122/78 | HR 81 | Ht 64.0 in | Wt 293.0 lb

## 2017-08-21 DIAGNOSIS — F324 Major depressive disorder, single episode, in partial remission: Secondary | ICD-10-CM

## 2017-08-21 MED ORDER — VORTIOXETINE HBR 5 MG PO TABS: 5.0000 mg | ORAL_TABLET | Freq: Every day | ORAL | 3 refills | Status: DC

## 2017-08-21 NOTE — Progress Notes (Signed)
Date:  08/21/2017   Name:  Victoria Dixon   DOB:  August 09, 1975   MRN:  [id number]   Chief Complaint: Depression (Taken samples and feeling much better - only side affect is being "jittery". PHQ9- 7) Depression         This is a chronic problem.  The problem has been gradually improving since onset.  Associated symptoms include no decreased concentration, no fatigue, no headaches and no suicidal ideas.     The symptoms are aggravated by social issues and work stress.  Past treatments include SSRIs - Selective serotonin reuptake inhibitors (started trintellix last visit).  Compliance with treatment is good.  Previous treatment provided significant (she is happy, enjoys her job, energy level is improved.  She has no sleep issues. ) relief. She says that she feels jittery but not anxious or nervous, she actually says maybe its just how she should feel since she is not depressed.  She has been on 10 mg for the past 3 weeks.  Review of Systems  Constitutional: Negative for chills, fatigue, fever and unexpected weight change.  Respiratory: Negative for cough, chest tightness and shortness of breath.   Cardiovascular: Negative for chest pain and palpitations.  Neurological: Negative for dizziness, tremors, light-headedness and headaches.  Hematological: Negative for adenopathy.  Psychiatric/Behavioral: Positive for depression. Negative for decreased concentration, dysphoric mood, sleep disturbance and suicidal ideas. The patient is not nervous/anxious.     Patient Active Problem List   Diagnosis Date Noted  . Vitamin B12 deficiency 07/01/2017  . Chronic nonintractable headache 05/05/2017  . Chronic pain of both knees 05/05/2017  . Major depressive disorder with single episode, in partial remission ([place]) 01/01/2016  . Abnormal mammogram 12/28/2015  . BMI 45.0-49.9, adult ([place]) 12/28/2015  . Environmental and seasonal allergies 08/10/2015  . Hypothyroidism due to acquired atrophy of thyroid  08/10/2015  . Vitamin D deficiency 08/10/2015  . Depression 05/04/2015  . Generalized anxiety disorder 05/04/2015    Prior to Admission medications   Medication Sig Start Date End Date Taking? Authorizing Provider  b complex vitamins capsule Take 1 capsule by mouth daily.   Yes [provider]  etonogestrel (NEXPLANON) 68 MG IMPL implant 1 each by Subdermal route once.   Yes [provider]  fexofenadine (ALLEGRA) 180 MG tablet Take 180 mg by mouth daily.   Yes [provider]  levothyroxine (SYNTHROID, LEVOTHROID) 75 MCG tablet Take 1 tablet (75 mcg total) by mouth daily before breakfast. 01/28/17  Yes Glean Hess, MD  topiramate (TOPAMAX) 25 MG tablet Take 50 mg by mouth.  06/19/17  Yes [provider]  traZODone (DESYREL) 50 MG tablet Take 1 tablet (50 mg total) by mouth at bedtime. 01/28/17  Yes Glean Hess, MD  Vitamin D, Ergocalciferol, (DRISDOL) 50000 units CAPS capsule Take 50,000 Units by mouth every 7 (seven) days.   Yes [provider]  DULoxetine (CYMBALTA) 60 MG capsule Take 1 capsule (60 mg total) by mouth daily. Patient not taking: Reported on 08/21/2017 01/28/17   Glean Hess, MD    Allergies  Allergen Reactions  . Erythromycin   . Azithromycin Nausea Only    And dizziness    Past Surgical History:  Procedure Laterality Date  . BILATERAL CARPAL TUNNEL RELEASE Bilateral 2010  . LEEP  2002   For abnormal pap, followed by cryosurgery    Social History   Tobacco Use  . Smoking status: Never Smoker  . Smokeless tobacco: Never Used  Substance Use Topics  . Alcohol use: Yes    Alcohol/week: 0.6 oz    Types: 1 Standard drinks or equivalent per week    Comment: Rarely  . Drug use: No     Medication list has been reviewed and updated.  Current Meds  Medication Sig  . b complex vitamins capsule Take 1 capsule by mouth daily.  Marland Kitchen etonogestrel (NEXPLANON) 68 MG IMPL implant 1 each by Subdermal route  once.  . fexofenadine (ALLEGRA) 180 MG tablet Take 180 mg by mouth daily.  Marland Kitchen levothyroxine (SYNTHROID, LEVOTHROID) 75 MCG tablet Take 1 tablet (75 mcg total) by mouth daily before breakfast.  . topiramate (TOPAMAX) 25 MG tablet Take 50 mg by mouth.   . traZODone (DESYREL) 50 MG tablet Take 1 tablet (50 mg total) by mouth at bedtime.  . Vitamin D, Ergocalciferol, (DRISDOL) 50000 units CAPS capsule Take 50,000 Units by mouth every 7 (seven) days.    PHQ 2/9 Scores 08/21/2017 07/31/2017 05/05/2017 01/28/2017  PHQ - 2 Score 2 4 6 2   PHQ- 9 Score 7 13 15 7     Physical Exam  Constitutional: She is oriented to person, place, and time. She appears well-developed. No distress.  HENT:  Head: Normocephalic and atraumatic.  Neck: Normal range of motion. Neck supple.  Cardiovascular: Normal rate, regular rhythm and normal heart sounds.  Pulmonary/Chest: Effort normal and breath sounds normal. No respiratory distress.  Musculoskeletal: She exhibits no edema.  Neurological: She is alert and oriented to person, place, and time.  Skin: Skin is warm and dry. No rash noted.  Psychiatric: She has a normal mood and affect. Her speech is normal and behavior is normal. Judgment and thought content normal. Cognition and memory are normal. She expresses no suicidal ideation.  Nursing note and vitals reviewed.   BP 122/78   Pulse 81   Ht 5' 4"  (1.626 m)   Wt 293 lb (132.9 kg)   SpO2 97%   BMI 50.29 kg/m   Assessment and Plan: 1. Major depressive disorder with single episode, in partial remission (HCC) Will decrease the dose to 5 mg to prevent worsening of jittery sensation Continue weekly counseling Call if needed, other wise follow up in 2 months FMLA for counseling will be completed. - vortioxetine HBr (TRINTELLIX) 5 MG TABS tablet; Take 1 tablet (5 mg total) by mouth daily.  Dispense: 30 tablet; Refill: 3   Meds ordered this encounter  Medications  . vortioxetine HBr (TRINTELLIX) 5 MG TABS tablet      Sig: Take 1 tablet (5 mg total) by mouth daily.    Dispense:  30 tablet    Refill:  3    Partially dictated using Editor, commissioning. Any errors are unintentional.  Halina Maidens, MD [facility]  08/21/2017

## 2017-08-21 NOTE — Telephone Encounter (Signed)
Patient PA done on Trintellix through covermymeds and was approved. They will fax over outcome form so we can attach to chart for records.

## 2017-09-22 ENCOUNTER — Encounter: Payer: Self-pay | Admitting: Internal Medicine

## 2017-09-22 ENCOUNTER — Other Ambulatory Visit: Payer: Self-pay | Admitting: Internal Medicine

## 2017-09-22 DIAGNOSIS — F324 Major depressive disorder, single episode, in partial remission: Secondary | ICD-10-CM

## 2017-09-22 MED ORDER — VORTIOXETINE HBR 10 MG PO TABS: 10.0000 mg | ORAL_TABLET | Freq: Every day | ORAL | 2 refills | Status: DC

## 2017-09-22 NOTE — Telephone Encounter (Signed)
Please advise mychart message from patient. Request for higher dose to be sent to pharmacy.

## 2017-10-20 ENCOUNTER — Encounter: Payer: Self-pay | Admitting: Internal Medicine

## 2017-10-22 ENCOUNTER — Ambulatory Visit: Payer: BLUE CROSS/BLUE SHIELD | Admitting: Internal Medicine

## 2017-11-13 DIAGNOSIS — Z23 Encounter for immunization: Secondary | ICD-10-CM | POA: Diagnosis not present

## 2017-12-12 ENCOUNTER — Other Ambulatory Visit: Payer: Self-pay | Admitting: Internal Medicine

## 2017-12-12 DIAGNOSIS — F324 Major depressive disorder, single episode, in partial remission: Secondary | ICD-10-CM

## 2018-02-01 ENCOUNTER — Other Ambulatory Visit: Payer: Self-pay | Admitting: Internal Medicine

## 2018-02-01 DIAGNOSIS — E034 Atrophy of thyroid (acquired): Secondary | ICD-10-CM

## 2018-02-23 ENCOUNTER — Ambulatory Visit: Payer: BLUE CROSS/BLUE SHIELD | Admitting: Internal Medicine

## 2018-02-23 ENCOUNTER — Encounter: Payer: Self-pay | Admitting: Internal Medicine

## 2018-02-23 VITALS — BP 121/81 | HR 79 | Ht 64.0 in | Wt 294.0 lb

## 2018-02-23 DIAGNOSIS — F324 Major depressive disorder, single episode, in partial remission: Secondary | ICD-10-CM

## 2018-02-23 DIAGNOSIS — G43809 Other migraine, not intractable, without status migrainosus: Secondary | ICD-10-CM

## 2018-02-23 DIAGNOSIS — E034 Atrophy of thyroid (acquired): Secondary | ICD-10-CM

## 2018-02-23 NOTE — Progress Notes (Signed)
Date:  02/23/2018   Name:  Victoria Dixon   DOB:  January 23, 1976   MRN:  [id number]   Chief Complaint: Depression (PHQ9- 7)  Depression         This is a chronic problem.The problem is unchanged.  Associated symptoms include headaches (occasional migraine).  Associated symptoms include no fatigue, no helplessness, no hopelessness, no decreased interest, not sad and no suicidal ideas.     The symptoms are aggravated by nothing.  Past treatments include SNRIs - Serotonin and norepinephrine reuptake inhibitors.   Review of Systems  Constitutional: Negative for chills, fatigue and unexpected weight change.  Respiratory: Negative for cough, chest tightness, shortness of breath and wheezing.   Cardiovascular: Negative for palpitations and leg swelling.  Gastrointestinal: Negative for abdominal pain, constipation and diarrhea.  Musculoskeletal: Negative for arthralgias.  Neurological: Positive for headaches (occasional migraine). Negative for dizziness, tremors and weakness.  Psychiatric/Behavioral: Positive for depression. Negative for dysphoric mood, sleep disturbance and suicidal ideas. The patient is not nervous/anxious.     Patient Active Problem List   Diagnosis Date Noted  . Vitamin B12 deficiency 07/01/2017  . Chronic nonintractable headache 05/05/2017  . Chronic pain of both knees 05/05/2017  . Major depressive disorder with single episode, in partial remission ([place]) 01/01/2016  . Abnormal mammogram 12/28/2015  . BMI 45.0-49.9, adult ([place]) 12/28/2015  . Environmental and seasonal allergies 08/10/2015  . Hypothyroidism due to acquired atrophy of thyroid 08/10/2015  . Vitamin D deficiency 08/10/2015  . Generalized anxiety disorder 05/04/2015    Allergies  Allergen Reactions  . Erythromycin   . Azithromycin Nausea Only    And dizziness    Past Surgical History:  Procedure Laterality Date  . BILATERAL CARPAL TUNNEL RELEASE Bilateral 2010  . LEEP  2002   For abnormal  pap, followed by cryosurgery    Social History   Tobacco Use  . Smoking status: Never Smoker  . Smokeless tobacco: Never Used  Substance Use Topics  . Alcohol use: Yes    Alcohol/week: 1.0 standard drinks    Types: 1 Standard drinks or equivalent per week    Comment: Rarely  . Drug use: No     Medication list has been reviewed and updated.  Current Meds  Medication Sig  . b complex vitamins capsule Take 1 capsule by mouth daily.  Marland Kitchen etonogestrel (NEXPLANON) 68 MG IMPL implant 1 each by Subdermal route once.  . fexofenadine (ALLEGRA) 180 MG tablet Take 180 mg by mouth daily.  Marland Kitchen levothyroxine (SYNTHROID, LEVOTHROID) 75 MCG tablet TAKE 1 TABLET (75 MCG TOTAL) BY MOUTH DAILY BEFORE BREAKFAST.  Marland Kitchen topiramate (TOPAMAX) 25 MG tablet Take 50 mg by mouth.   . traZODone (DESYREL) 50 MG tablet Take 1 tablet (50 mg total) by mouth at bedtime.  . TRINTELLIX 10 MG TABS tablet TAKE 1 TABLET BY MOUTH EVERY DAY  . Vitamin D, Ergocalciferol, (DRISDOL) 50000 units CAPS capsule Take 50,000 Units by mouth every 7 (seven) days.    PHQ 2/9 Scores 02/23/2018 08/21/2017 07/31/2017 05/05/2017  PHQ - 2 Score 0 2 4 6   PHQ- 9 Score 7 7 13 15    Wt Readings from Last 3 Encounters:  02/23/18 294 lb (133.4 kg)  08/21/17 293 lb (132.9 kg)  07/31/17 292 lb (132.5 kg)    Physical Exam Vitals signs and nursing note reviewed.  Constitutional:      General: She is not in acute distress.    Appearance: She is well-developed.  HENT:  Head: Normocephalic and atraumatic.  Eyes:     Pupils: Pupils are equal, round, and reactive to light.  Neck:     Musculoskeletal: Normal range of motion and neck supple.  Cardiovascular:     Rate and Rhythm: Normal rate and regular rhythm.  Pulmonary:     Effort: Pulmonary effort is normal. No respiratory distress.     Breath sounds: Normal breath sounds.  Musculoskeletal: Normal range of motion.     Right lower leg: No edema.     Left lower leg: No edema.  Skin:     General: Skin is warm and dry.     Findings: No rash.  Neurological:     Mental Status: She is alert and oriented to person, place, and time.  Psychiatric:        Behavior: Behavior normal.        Thought Content: Thought content normal.     BP 121/81   Pulse 79   Ht 5' 4"  (1.626 m)   Wt 294 lb (133.4 kg)   SpO2 96%   BMI 50.46 kg/m   Assessment and Plan: 1. Major depressive disorder with single episode, in partial remission ([place]) Pt is doing well on current therapy  2. Hypothyroidism due to acquired atrophy of thyroid Supplemented  3. Migraine variants, not intractable Using Topamax intermittently when headache clusters start   Partially dictated using Editor, commissioning. Any errors are unintentional.  Halina Maidens, MD [facility]  02/23/2018

## 2018-06-18 ENCOUNTER — Other Ambulatory Visit: Payer: Self-pay | Admitting: Internal Medicine

## 2018-06-18 DIAGNOSIS — F324 Major depressive disorder, single episode, in partial remission: Secondary | ICD-10-CM

## 2018-07-15 ENCOUNTER — Encounter: Payer: Self-pay | Admitting: Internal Medicine

## 2018-07-15 ENCOUNTER — Telehealth: Payer: Self-pay

## 2018-07-15 NOTE — Telephone Encounter (Signed)
Sent patient my chart message about this and informed her to request refills from Dr. Manuella Ghazi. She has not done this yet.

## 2018-07-15 NOTE — Telephone Encounter (Signed)
Patient said she needs RF on topiramate. She was getting this from Dr. Manuella Ghazi however she said she has only seen him once, and had a MRI another time. She said she discussed this with you before and said you would refill?  Please Advise.

## 2018-07-15 NOTE — Telephone Encounter (Signed)
Dr. Manuella Ghazi filled this for her on 04/21/2018 for #90.  Unless he said that she needed to be seen, he should be willing to continue to fill it.   Have her pursue this route first.  I don't think that I have specifically addressed her migraines with her in the past 6 months.

## 2018-07-16 NOTE — Telephone Encounter (Signed)
Please advise patient messages. We sent her to Dr. Manuella Ghazi for migraines and she wanted [place] to send this in for her. I asked her to request from Dr. Manuella Ghazi per Dr. Army Melia and she seemed upset. If she would like to speak to Dr. Army Melia she does need to come in for a visit. Dr. Army Melia cannot just talk to her on the phone.  Please advise.

## 2018-08-14 ENCOUNTER — Other Ambulatory Visit: Payer: Self-pay | Admitting: Internal Medicine

## 2018-08-14 DIAGNOSIS — E034 Atrophy of thyroid (acquired): Secondary | ICD-10-CM

## 2018-09-28 ENCOUNTER — Encounter: Payer: Self-pay | Admitting: Internal Medicine

## 2018-10-13 ENCOUNTER — Encounter: Payer: BLUE CROSS/BLUE SHIELD | Admitting: Internal Medicine

## 2018-12-11 ENCOUNTER — Other Ambulatory Visit: Payer: Self-pay | Admitting: Internal Medicine

## 2018-12-11 DIAGNOSIS — F324 Major depressive disorder, single episode, in partial remission: Secondary | ICD-10-CM

## 2018-12-22 ENCOUNTER — Encounter: Payer: Self-pay | Admitting: Internal Medicine

## 2019-02-09 ENCOUNTER — Other Ambulatory Visit: Payer: Self-pay | Admitting: Internal Medicine

## 2019-02-09 DIAGNOSIS — E034 Atrophy of thyroid (acquired): Secondary | ICD-10-CM

## 2019-03-25 ENCOUNTER — Other Ambulatory Visit: Payer: Self-pay | Admitting: Internal Medicine

## 2019-04-01 ENCOUNTER — Ambulatory Visit: Payer: BC Managed Care – PPO | Admitting: Internal Medicine

## 2019-04-01 ENCOUNTER — Encounter: Payer: Self-pay | Admitting: Internal Medicine

## 2019-04-01 ENCOUNTER — Other Ambulatory Visit (HOSPITAL_COMMUNITY)
Admission: RE | Admit: 2019-04-01 | Discharge: 2019-04-01 | Disposition: A | Discharge status: Home / Self Care | Payer: BLUE CROSS/BLUE SHIELD | Source: Ambulatory Visit | Attending: Internal Medicine | Admitting: Internal Medicine

## 2019-04-01 ENCOUNTER — Other Ambulatory Visit: Payer: Self-pay

## 2019-04-01 VITALS — BP 124/70 | HR 84 | Temp 98.8°F | Ht 64.0 in | Wt 298.0 lb

## 2019-04-01 DIAGNOSIS — Z124 Encounter for screening for malignant neoplasm of cervix: Secondary | ICD-10-CM | POA: Diagnosis not present

## 2019-04-01 DIAGNOSIS — Z1231 Encounter for screening mammogram for malignant neoplasm of breast: Secondary | ICD-10-CM | POA: Diagnosis not present

## 2019-04-01 DIAGNOSIS — G43809 Other migraine, not intractable, without status migrainosus: Secondary | ICD-10-CM | POA: Diagnosis not present

## 2019-04-01 DIAGNOSIS — F324 Major depressive disorder, single episode, in partial remission: Secondary | ICD-10-CM

## 2019-04-01 DIAGNOSIS — Z0000 Encounter for general adult medical examination without abnormal findings: Secondary | ICD-10-CM

## 2019-04-01 DIAGNOSIS — Z6841 Body Mass Index (BMI) 40.0 and over, adult: Secondary | ICD-10-CM

## 2019-04-01 DIAGNOSIS — E034 Atrophy of thyroid (acquired): Secondary | ICD-10-CM

## 2019-04-01 DIAGNOSIS — I7300 Raynaud's syndrome without gangrene: Secondary | ICD-10-CM | POA: Diagnosis not present

## 2019-04-01 LAB — POCT URINALYSIS DIPSTICK
Bilirubin, UA: NEGATIVE
Blood, UA: NEGATIVE
Glucose, UA: NEGATIVE
Ketones, UA: NEGATIVE
Leukocytes, UA: NEGATIVE
Nitrite, UA: NEGATIVE
Protein, UA: POSITIVE — AB
Spec Grav, UA: 1.01 (ref 1.010–1.025)
Urobilinogen, UA: 0.2 E.U./dL
pH, UA: 6.5 (ref 5.0–8.0)

## 2019-04-01 NOTE — Progress Notes (Addendum)
Date:  04/01/2019   Name:  Victoria Dixon   DOB:  1975/05/31   MRN:  [id number]   Chief Complaint: Annual Exam (Breast Exam with papsmear. Patient aware she may recieve a bill outside of todays visit. She did pay her copayment today to discuss other things while here. ) and Cold hands (Started X 2 months. Constantly. No tingling or numbness.) Victoria Dixon is a 44 y.o. female who presents today for her Complete Annual Exam. She feels well. She reports exercising rarely but walking the dog several times a day. She reports she is sleeping well.   Mammogram  12/2015 Pap ?2015 with hx of LEEP Immunization History  Administered Date(s) Administered  . Influenza-Unspecified 11/22/2017, 11/06/2018  . Tdap 04/25/2016, 05/30/2017    Thyroid Problem Presents for follow-up visit. Patient reports no anxiety, constipation, diarrhea, fatigue, palpitations or tremors. The symptoms have been stable.  Depression        This is a chronic problem.The problem is unchanged.  Associated symptoms include no fatigue and no headaches (rare, mild headaches).  Past treatments include SNRIs - Serotonin and norepinephrine reuptake inhibitors.  Compliance with treatment is good.  Previous treatment provided moderate relief.  Past medical history includes thyroid problem.   Migraine  This is a recurrent problem. Episode frequency: almost completely resolved - now only has a mild headache about every 4 months. The problem has been resolved. Pertinent negatives include no abdominal pain, coughing, dizziness, fever, hearing loss, tinnitus or vomiting. Treatments tried: topiramate.  Cold fingers - over the past 6 months or more she has noticed that her fingers on both hands are very cold.  Occasionaly they get cold enough to be painful.  Never turn white or blue.  She has tried to keep her hands warm.  No similar symptoms on her toes.  Lab Results  Component Value Date   CREATININE 0.75 01/28/2017   BUN 10  01/28/2017   NA 142 01/28/2017   K 4.5 01/28/2017   CL 104 01/28/2017   CO2 25 01/28/2017   Lab Results  Component Value Date   CHOL 165 01/28/2017   HDL 45 01/28/2017   LDLCALC 102 (H) 01/28/2017   TRIG 91 01/28/2017   CHOLHDL 3.7 01/28/2017   Lab Results  Component Value Date   TSH 0.566 07/31/2017   Lab Results  Component Value Date   HGBA1C 5.8 (H) 07/31/2017     Review of Systems  Constitutional: Negative for chills, fatigue, fever and unexpected weight change.  HENT: Negative for congestion, hearing loss, tinnitus, trouble swallowing and voice change.   Eyes: Negative for visual disturbance.  Respiratory: Negative for cough, chest tightness, shortness of breath and wheezing.   Cardiovascular: Negative for chest pain, palpitations and leg swelling.  Gastrointestinal: Negative for abdominal pain, constipation, diarrhea and vomiting.  Endocrine: Negative for polydipsia and polyuria.  Genitourinary: Negative for dysuria, frequency, genital sores, vaginal bleeding and vaginal discharge.  Musculoskeletal: Negative for arthralgias, gait problem and joint swelling.  Skin: Negative for color change (pale cold fingers) and rash.  Allergic/Immunologic: Negative for environmental allergies.  Neurological: Negative for dizziness, tremors, light-headedness and headaches (rare, mild headaches).  Hematological: Negative for adenopathy. Does not bruise/bleed easily.  Psychiatric/Behavioral: Positive for depression. Negative for dysphoric mood and sleep disturbance. The patient is not nervous/anxious.     Patient Active Problem List   Diagnosis Date Noted  . Vitamin B12 deficiency 07/01/2017  . Migraine variants, not intractable 05/05/2017  . Chronic  pain of both knees 05/05/2017  . Major depressive disorder with single episode, in partial remission ([place]) 01/01/2016  . Abnormal mammogram 12/28/2015  . BMI 45.0-49.9, adult ([place]) 12/28/2015  . Environmental and seasonal allergies  08/10/2015  . Hypothyroidism due to acquired atrophy of thyroid 08/10/2015  . Vitamin D deficiency 08/10/2015  . Generalized anxiety disorder 05/04/2015    Allergies  Allergen Reactions  . Erythromycin   . Azithromycin Nausea Only    And dizziness    Past Surgical History:  Procedure Laterality Date  . BILATERAL CARPAL TUNNEL RELEASE Bilateral 2010  . LEEP  2002   For abnormal pap, followed by cryosurgery    Social History   Tobacco Use  . Smoking status: Never Smoker  . Smokeless tobacco: Never Used  Substance Use Topics  . Alcohol use: Yes    Alcohol/week: 1.0 standard drinks    Types: 1 Standard drinks or equivalent per week    Comment: Rarely  . Drug use: No     Medication list has been reviewed and updated.  Current Meds  Medication Sig  . b complex vitamins capsule Take 1 capsule by mouth daily.  . Cetirizine HCl 10 MG CAPS   . etonogestrel (NEXPLANON) 68 MG IMPL implant 1 each by Subdermal route once.  Marland Kitchen levothyroxine (SYNTHROID) 75 MCG tablet TAKE 1 TABLET (75 MCG TOTAL) BY MOUTH DAILY BEFORE BREAKFAST.  Marland Kitchen topiramate (TOPAMAX) 50 MG tablet Take 50 mg by mouth daily.  . TRINTELLIX 10 MG TABS tablet TAKE 1 TABLET BY MOUTH EVERY DAY  . Vitamin D, Ergocalciferol, (DRISDOL) 50000 units CAPS capsule Take 50,000 Units by mouth every 7 (seven) days.    PHQ 2/9 Scores 04/01/2019 02/23/2018 08/21/2017 07/31/2017  PHQ - 2 Score 0 0 2 4  PHQ- 9 Score 0 7 7 13     BP Readings from Last 3 Encounters:  04/01/19 124/70  02/23/18 121/81  08/21/17 122/78    Physical Exam Vitals and nursing note reviewed.  Constitutional:      General: She is not in acute distress.    Appearance: She is well-developed. She is obese.  HENT:     Head: Normocephalic and atraumatic.     Right Ear: Tympanic membrane and ear canal normal.     Left Ear: Tympanic membrane and ear canal normal.     Nose:     Right Sinus: No maxillary sinus tenderness.     Left Sinus: No maxillary sinus  tenderness.  Eyes:     General: No scleral icterus.       Right eye: No discharge.        Left eye: No discharge.     Conjunctiva/sclera: Conjunctivae normal.  Neck:     Thyroid: No thyromegaly.     Vascular: No carotid bruit.  Cardiovascular:     Rate and Rhythm: Normal rate and regular rhythm.     Pulses: Normal pulses.     Heart sounds: Normal heart sounds.  Pulmonary:     Effort: Pulmonary effort is normal. No respiratory distress.     Breath sounds: No wheezing.  Chest:     Breasts:        Right: No mass, nipple discharge, skin change or tenderness.        Left: No mass, nipple discharge, skin change or tenderness.  Abdominal:     General: Bowel sounds are normal.     Palpations: Abdomen is soft.     Tenderness: There is no abdominal tenderness.  Genitourinary:    Labia:        Right: No tenderness, lesion or injury.        Left: No tenderness, lesion or injury.      Vagina: Normal.     Uterus: Normal.      Adnexa: Right adnexa normal and left adnexa normal.     Comments: Cervix difficult to visualize.  Pap obtained Musculoskeletal:        General: Normal range of motion.     Cervical back: Normal range of motion. No erythema.     Right lower leg: No edema.     Left lower leg: No edema.  Lymphadenopathy:     Cervical: No cervical adenopathy.  Skin:    General: Skin is warm and dry.     Capillary Refill: Capillary refill takes less than 2 seconds.     Findings: No rash.     Comments: Fingers cool to touch; no cyanosis or blanching Circulation at the wrist normal bilaterally  Neurological:     General: No focal deficit present.     Mental Status: She is alert and oriented to person, place, and time.     Cranial Nerves: No cranial nerve deficit.     Sensory: No sensory deficit.     Deep Tendon Reflexes: Reflexes are normal and symmetric.  Psychiatric:        Attention and Perception: Attention normal.        Mood and Affect: Mood normal.        Speech: Speech  normal.        Behavior: Behavior normal.        Thought Content: Thought content normal.     Wt Readings from Last 3 Encounters:  04/01/19 298 lb (135.2 kg)  02/23/18 294 lb (133.4 kg)  08/21/17 293 lb (132.9 kg)    BP 124/70   Pulse 84   Temp 98.8 F (37.1 C) (Oral)   Ht 5' 4"  (1.626 m)   Wt 298 lb (135.2 kg)   SpO2 99%   BMI 51.15 kg/m   Assessment and Plan: 1. Annual physical exam Normal exam except for weight Continue to increase physical activity and work on diet changes - CBC with Differential/Platelet - Comprehensive metabolic panel - Lipid panel - POCT urinalysis dipstick  2. Encounter for screening mammogram for breast cancer Referred to [place]; Future  3. Hypothyroidism due to acquired atrophy of thyroid Supplemented; no symptoms to suggest poor control - TSH + free T4  4. Major depressive disorder with single episode, in partial remission ([facility]) Clinically stable on current regimen with good control of symptoms, No SI or HI. Will continue current therapy. She is very happy working from home and hopes to continue.  5. Encounter for screening for cervical cancer  - Cytology - PAP  6. Raynaud's disease without gangrene Patient is reassured - keep hands warm, avoid ice, cold water and any other similar exposure  7. Migraine variants, not intractable Excellent improvement with topiramate  8. BMI 50.0-59.9, adult ([place])   Partially dictated using Editor, commissioning. Any errors are unintentional.  Halina Maidens, MD [facility]  04/01/2019

## 2019-04-02 LAB — CBC WITH DIFFERENTIAL/PLATELET
Basophils Absolute: 0.1 10*3/uL (ref 0.0–0.2)
Basos: 1 %
EOS (ABSOLUTE): 0.1 10*3/uL (ref 0.0–0.4)
Eos: 1 %
Hematocrit: 42.2 % (ref 34.0–46.6)
Hemoglobin: 14.5 g/dL (ref 11.1–15.9)
Immature Grans (Abs): 0 10*3/uL (ref 0.0–0.1)
Immature Granulocytes: 0 %
Lymphocytes Absolute: 1.9 10*3/uL (ref 0.7–3.1)
Lymphs: 21 %
MCH: 30.9 pg (ref 26.6–33.0)
MCHC: 34.4 g/dL (ref 31.5–35.7)
MCV: 90 fL (ref 79–97)
Monocytes Absolute: 0.4 10*3/uL (ref 0.1–0.9)
Monocytes: 4 %
Neutrophils Absolute: 6.4 10*3/uL (ref 1.4–7.0)
Neutrophils: 73 %
Platelets: 223 10*3/uL (ref 150–450)
RBC: 4.69 x10E6/uL (ref 3.77–5.28)
RDW: 12.1 % (ref 11.7–15.4)
WBC: 8.9 10*3/uL (ref 3.4–10.8)

## 2019-04-02 LAB — COMPREHENSIVE METABOLIC PANEL
ALT: 19 IU/L (ref 0–32)
AST: 17 IU/L (ref 0–40)
Albumin/Globulin Ratio: 1.5 (ref 1.2–2.2)
Albumin: 4.1 g/dL (ref 3.8–4.8)
Alkaline Phosphatase: 109 IU/L (ref 39–117)
BUN/Creatinine Ratio: 17 (ref 9–23)
BUN: 12 mg/dL (ref 6–24)
Bilirubin Total: 0.5 mg/dL (ref 0.0–1.2)
CO2: 22 mmol/L (ref 20–29)
Calcium: 9.4 mg/dL (ref 8.7–10.2)
Chloride: 106 mmol/L (ref 96–106)
Creatinine, Ser: 0.7 mg/dL (ref 0.57–1.00)
GFR calc Af Amer: 123 mL/min/{1.73_m2} (ref >59)
GFR calc non Af Amer: 106 mL/min/{1.73_m2} (ref >59)
Globulin, Total: 2.8 g/dL (ref 1.5–4.5)
Glucose: 92 mg/dL (ref 65–99)
Potassium: 4.5 mmol/L (ref 3.5–5.2)
Sodium: 140 mmol/L (ref 134–144)
Total Protein: 6.9 g/dL (ref 6.0–8.5)

## 2019-04-02 LAB — LIPID PANEL
Chol/HDL Ratio: 3.4 ratio (ref 0.0–4.4)
Cholesterol, Total: 154 mg/dL (ref 100–199)
HDL: 45 mg/dL (ref >39)
LDL Chol Calc (NIH): 88 mg/dL (ref 0–99)
Triglycerides: 113 mg/dL (ref 0–149)
VLDL Cholesterol Cal: 21 mg/dL (ref 5–40)

## 2019-04-02 LAB — TSH+FREE T4
Free T4: 1.38 ng/dL (ref 0.82–1.77)
TSH: 0.834 u[IU]/mL (ref 0.450–4.500)

## 2019-04-04 LAB — CYTOLOGY - PAP
Comment: NEGATIVE
Diagnosis: NEGATIVE
High risk HPV: NEGATIVE

## 2019-06-23 ENCOUNTER — Other Ambulatory Visit: Payer: Self-pay | Admitting: Internal Medicine

## 2019-06-23 DIAGNOSIS — F324 Major depressive disorder, single episode, in partial remission: Secondary | ICD-10-CM

## 2019-06-23 NOTE — Telephone Encounter (Signed)
Approved per protocol.  Requested Prescriptions  Pending Prescriptions Disp Refills  . TRINTELLIX 10 MG TABS tablet [Pharmacy Med Name: TRINTELLIX 10 MG TABLET] 30 tablet 3    Sig: TAKE 1 TABLET BY MOUTH EVERY DAY     Psychiatry: Antidepressants - Serotonin Modulator Passed - 06/23/2019  8:26 AM      Passed - Completed PHQ-2 or PHQ-9 in the last 360 days.      Passed - Valid encounter within last 6 months    Recent Outpatient Visits          2 months ago Annual physical exam   [facility] Glean Hess, MD   1 year ago Hypothyroidism due to acquired atrophy of thyroid   [facility] Glean Hess, MD   1 year ago Major depressive disorder with single episode, in partial remission [facility])   [facility] Glean Hess, MD   1 year ago Hypothyroidism due to acquired atrophy of thyroid   [facility] Glean Hess, MD   2 years ago Chronic nonintractable headache, unspecified headache type   [facility] Glean Hess, MD

## 2019-07-12 ENCOUNTER — Other Ambulatory Visit: Payer: Self-pay | Admitting: Internal Medicine

## 2019-07-12 ENCOUNTER — Encounter: Payer: Self-pay | Admitting: Internal Medicine

## 2019-07-12 DIAGNOSIS — G43809 Other migraine, not intractable, without status migrainosus: Secondary | ICD-10-CM

## 2019-07-12 MED ORDER — TOPIRAMATE 50 MG PO TABS: 50.0000 mg | ORAL_TABLET | Freq: Every day | ORAL | 5 refills | Status: DC

## 2019-08-19 ENCOUNTER — Other Ambulatory Visit: Payer: Self-pay | Admitting: Internal Medicine

## 2019-08-19 DIAGNOSIS — E034 Atrophy of thyroid (acquired): Secondary | ICD-10-CM

## 2019-10-05 ENCOUNTER — Encounter: Payer: Self-pay | Admitting: Internal Medicine

## 2019-10-06 ENCOUNTER — Other Ambulatory Visit: Payer: Self-pay | Admitting: Internal Medicine

## 2019-10-06 DIAGNOSIS — F324 Major depressive disorder, single episode, in partial remission: Secondary | ICD-10-CM

## 2019-10-06 MED ORDER — VORTIOXETINE HBR 10 MG PO TABS: 10.0000 mg | ORAL_TABLET | Freq: Every day | ORAL | 1 refills | Status: DC

## 2019-11-08 ENCOUNTER — Other Ambulatory Visit: Payer: Self-pay

## 2019-11-08 ENCOUNTER — Encounter: Payer: Self-pay | Admitting: Internal Medicine

## 2019-11-08 ENCOUNTER — Other Ambulatory Visit: Payer: Self-pay | Admitting: Internal Medicine

## 2019-11-08 DIAGNOSIS — E034 Atrophy of thyroid (acquired): Secondary | ICD-10-CM

## 2019-11-08 MED ORDER — FLUCONAZOLE 150 MG PO TABS: 150.0000 mg | ORAL_TABLET | Freq: Every day | ORAL | 0 refills | Status: DC

## 2019-11-08 MED ORDER — FLUCONAZOLE 150 MG PO TABS: 150.0000 mg | ORAL_TABLET | Freq: Every day | ORAL | 0 refills | Status: AC

## 2019-11-08 NOTE — Progress Notes (Signed)
diflu

## 2019-11-08 NOTE — Telephone Encounter (Signed)
Requested Prescriptions  Pending Prescriptions Disp Refills   levothyroxine (SYNTHROID) 75 MCG tablet [Pharmacy Med Name: LEVOTHYROXINE 75 MCG TABLET] 90 tablet 1    Sig: TAKE 1 TABLET (75 MCG TOTAL) BY MOUTH DAILY BEFORE BREAKFAST.     Endocrinology:  Hypothyroid Agents Failed - 11/08/2019  4:24 PM      Failed - TSH needs to be rechecked within 3 months after an abnormal result. Refill until TSH is due.      Passed - TSH in normal range and within 360 days    TSH  Date Value Ref Range Status  04/01/2019 0.834 0.450 - 4.500 uIU/mL Final         Passed - Valid encounter within last 12 months    Recent Outpatient Visits          7 months ago Annual physical exam   [facility] Glean Hess, MD   1 year ago Hypothyroidism due to acquired atrophy of thyroid   [facility] Glean Hess, MD   2 years ago Major depressive disorder with single episode, in partial remission [facility])   [facility] Glean Hess, MD   2 years ago Hypothyroidism due to acquired atrophy of thyroid   [facility] Glean Hess, MD   2 years ago Chronic nonintractable headache, unspecified headache type   [facility] Glean Hess, MD

## 2019-11-12 DIAGNOSIS — B356 Tinea cruris: Secondary | ICD-10-CM | POA: Diagnosis not present

## 2019-11-17 ENCOUNTER — Encounter: Payer: Self-pay | Admitting: Internal Medicine

## 2020-01-03 ENCOUNTER — Other Ambulatory Visit: Payer: Self-pay | Admitting: Internal Medicine

## 2020-01-03 DIAGNOSIS — G43809 Other migraine, not intractable, without status migrainosus: Secondary | ICD-10-CM

## 2020-01-03 NOTE — Telephone Encounter (Signed)
Refills

## 2020-01-03 NOTE — Telephone Encounter (Signed)
Requested medication (s) are due for refill today:  Yes  Requested medication (s) are on the active medication list:  Yes  Future visit scheduled:  No  Last Refill: 07/12/19; #30; RF x 5  Notes to Clinic:  not delegated  Requested Prescriptions  Pending Prescriptions Disp Refills   topiramate (TOPAMAX) 50 MG tablet [Pharmacy Med Name: TOPIRAMATE 50 MG TABLET] 90 tablet 1    Sig: TAKE 1 TABLET BY MOUTH EVERYDAY AT BEDTIME      Not Delegated - Neurology: Anticonvulsants - topiramate & zonisamide Failed - 01/03/2020  8:07 AM      Failed - This refill cannot be delegated      Passed - Cr in normal range and within 360 days    Creatinine, Ser  Date Value Ref Range Status  04/01/2019 0.70 0.57 - 1.00 mg/dL Final          Passed - CO2 in normal range and within 360 days    CO2  Date Value Ref Range Status  04/01/2019 22 20 - 29 mmol/L Final          Passed - Valid encounter within last 12 months    Recent Outpatient Visits           9 months ago Annual physical exam   [facility] Glean Hess, MD   1 year ago Hypothyroidism due to acquired atrophy of thyroid   [facility] Glean Hess, MD   2 years ago Major depressive disorder with single episode, in partial remission [facility])   [facility] Glean Hess, MD   2 years ago Hypothyroidism due to acquired atrophy of thyroid   [facility] Glean Hess, MD   2 years ago Chronic nonintractable headache, unspecified headache type   [facility] Glean Hess, MD

## 2020-01-08 ENCOUNTER — Encounter: Payer: Self-pay | Admitting: Internal Medicine

## 2020-01-12 ENCOUNTER — Encounter: Payer: Self-pay | Admitting: Internal Medicine

## 2020-01-12 ENCOUNTER — Ambulatory Visit: Payer: BC Managed Care – PPO | Admitting: Internal Medicine

## 2020-01-12 ENCOUNTER — Other Ambulatory Visit: Payer: Self-pay

## 2020-01-12 VITALS — BP 122/84 | HR 73 | Temp 99.0°F | Ht 64.0 in | Wt 297.0 lb

## 2020-01-12 DIAGNOSIS — G43809 Other migraine, not intractable, without status migrainosus: Secondary | ICD-10-CM | POA: Diagnosis not present

## 2020-01-12 DIAGNOSIS — F324 Major depressive disorder, single episode, in partial remission: Secondary | ICD-10-CM

## 2020-01-12 DIAGNOSIS — Z23 Encounter for immunization: Secondary | ICD-10-CM | POA: Diagnosis not present

## 2020-01-12 MED ORDER — TOPIRAMATE 50 MG PO TABS: 50.0000 mg | ORAL_TABLET | Freq: Every day | ORAL | 3 refills | Status: DC

## 2020-01-12 NOTE — Progress Notes (Signed)
Date:  01/12/2020   Name:  Victoria Dixon   DOB:  30-Jun-1975   MRN:  [id number]   Chief Complaint: Migraine (F/u/) and Flu Vaccine  Migraine  This is a recurrent problem. Episode frequency: every 6-8 weeks lasting several days. The pain is located in the right unilateral region. The pain does not radiate. The pain quality is similar to prior headaches. Pertinent negatives include no coughing, dizziness, fever, nausea, phonophobia, photophobia or vomiting. Treatments tried: topamax. The treatment provided significant relief.  Depression        This is a chronic problem.  The problem has been resolved since onset.  Associated symptoms include headaches.  Associated symptoms include no fatigue.  Past treatments include SNRIs - Serotonin and norepinephrine reuptake inhibitors.  Compliance with treatment is good.  Previous treatment provided significant relief.   Lab Results  Component Value Date   CREATININE 0.70 04/01/2019   BUN 12 04/01/2019   NA 140 04/01/2019   K 4.5 04/01/2019   CL 106 04/01/2019   CO2 22 04/01/2019   Lab Results  Component Value Date   CHOL 154 04/01/2019   HDL 45 04/01/2019   LDLCALC 88 04/01/2019   TRIG 113 04/01/2019   CHOLHDL 3.4 04/01/2019   Lab Results  Component Value Date   TSH 0.834 04/01/2019   Lab Results  Component Value Date   HGBA1C 5.8 (H) 07/31/2017   Lab Results  Component Value Date   WBC 8.9 04/01/2019   HGB 14.5 04/01/2019   HCT 42.2 04/01/2019   MCV 90 04/01/2019   PLT 223 04/01/2019   Lab Results  Component Value Date   ALT 19 04/01/2019   AST 17 04/01/2019   ALKPHOS 109 04/01/2019   BILITOT 0.5 04/01/2019     Review of Systems  Constitutional: Negative for chills, fatigue and fever.  HENT: Negative for trouble swallowing.   Eyes: Negative for photophobia and visual disturbance.  Respiratory: Negative for cough, choking, shortness of breath and wheezing.   Cardiovascular: Negative for chest pain, palpitations  and leg swelling.  Gastrointestinal: Negative for nausea and vomiting.  Neurological: Positive for headaches. Negative for dizziness and light-headedness.  Psychiatric/Behavioral: Positive for depression. Negative for dysphoric mood and sleep disturbance. The patient is not nervous/anxious.     Patient Active Problem List   Diagnosis Date Noted  . Raynaud's disease without gangrene 04/01/2019  . Vitamin B12 deficiency 07/01/2017  . Migraine variants, not intractable 05/05/2017  . Chronic pain of both knees 05/05/2017  . Major depressive disorder with single episode, in partial remission ([place]) 01/01/2016  . Abnormal mammogram 12/28/2015  . BMI 45.0-49.9, adult ([place]) 12/28/2015  . Environmental and seasonal allergies 08/10/2015  . Hypothyroidism due to acquired atrophy of thyroid 08/10/2015  . Vitamin D deficiency 08/10/2015  . Generalized anxiety disorder 05/04/2015    Allergies  Allergen Reactions  . Erythromycin   . Azithromycin Nausea Only    And dizziness    Past Surgical History:  Procedure Laterality Date  . BILATERAL CARPAL TUNNEL RELEASE Bilateral 2010  . LEEP  2002   For abnormal pap, followed by cryosurgery    Social History   Tobacco Use  . Smoking status: Never Smoker  . Smokeless tobacco: Never Used  Substance Use Topics  . Alcohol use: Yes    Alcohol/week: 1.0 standard drink    Types: 1 Standard drinks or equivalent per week    Comment: Rarely  . Drug use: No  Medication list has been reviewed and updated.  Current Meds  Medication Sig  . b complex vitamins capsule Take 1 capsule by mouth daily.  . Cetirizine HCl 10 MG CAPS   . etonogestrel (NEXPLANON) 68 MG IMPL implant 1 each by Subdermal route once.  Marland Kitchen levothyroxine (SYNTHROID) 75 MCG tablet TAKE 1 TABLET (75 MCG TOTAL) BY MOUTH DAILY BEFORE BREAKFAST.  Marland Kitchen topiramate (TOPAMAX) 50 MG tablet TAKE 1 TABLET BY MOUTH EVERYDAY AT BEDTIME  . Vitamin D, Ergocalciferol, (DRISDOL) 50000 units CAPS  capsule Take 50,000 Units by mouth every 7 (seven) days.  Marland Kitchen vortioxetine HBr (TRINTELLIX) 10 MG TABS tablet Take 1 tablet (10 mg total) by mouth daily.    PHQ 2/9 Scores 01/12/2020 04/01/2019 02/23/2018 08/21/2017  PHQ - 2 Score 0 0 0 2  PHQ- 9 Score 0 0 7 7    GAD 7 : Generalized Anxiety Score 01/12/2020  Nervous, Anxious, on Edge 0  Control/stop worrying 0  Worry too much - different things 0  Trouble relaxing 0  Restless 0  Easily annoyed or irritable 0  Afraid - awful might happen 0  Total GAD 7 Score 0    BP Readings from Last 3 Encounters:  01/12/20 122/84  04/01/19 124/70  02/23/18 121/81    Physical Exam Vitals and nursing note reviewed.  Constitutional:      General: She is not in acute distress.    Appearance: She is well-developed.  HENT:     Head: Normocephalic and atraumatic.  Cardiovascular:     Rate and Rhythm: Normal rate and regular rhythm.     Heart sounds: No murmur heard.   Pulmonary:     Effort: Pulmonary effort is normal. No respiratory distress.     Breath sounds: No wheezing or rhonchi.  Musculoskeletal:     Cervical back: Normal range of motion.     Right lower leg: No edema.     Left lower leg: No edema.  Lymphadenopathy:     Cervical: No cervical adenopathy.  Skin:    General: Skin is warm and dry.     Findings: No rash.  Neurological:     Mental Status: She is alert and oriented to person, place, and time.  Psychiatric:        Mood and Affect: Mood and affect and mood normal.     Wt Readings from Last 3 Encounters:  01/12/20 297 lb (134.7 kg)  04/01/19 298 lb (135.2 kg)  02/23/18 294 lb (133.4 kg)    BP 122/84   Pulse 73   Temp 99 F (37.2 C) (Oral)   Ht 5' 4"  (1.626 m)   Wt 297 lb (134.7 kg)   SpO2 99%   BMI 50.98 kg/m   Assessment and Plan: 1. Migraine variants, not intractable Continue topamax for prevention Recommend dosing over the counter nsaids on the first day of her recurrent headaches Continue adequate  hydration - topiramate (TOPAMAX) 50 MG tablet; Take 1 tablet (50 mg total) by mouth daily.  Dispense: 90 tablet; Refill: 3  2. Major depressive disorder with single episode, in partial remission (HCC) Clinically stable on current regimen with good control of symptoms, No SI or HI. Will continue current therapy.  3. Need for immunization against influenza - Flu Vaccine QUAD 36+ mos IM   Partially dictated using Editor, commissioning. Any errors are unintentional.  Halina Maidens, MD [facility]  01/12/2020

## 2020-03-01 IMAGING — MR MR HEAD WO/W CM
7 series · 48 of 48 positions shown · IV contrast (multihance)
Comparison: None.

CLINICAL DATA: 41-year-old female with chronic headaches.
Over-the-counter medications without relief. Was told in 6733 she
had pseudotumor cerebri.

EXAM:
MRI HEAD WITHOUT AND WITH CONTRAST
TECHNIQUE: Multiplanar, multiecho pulse sequences of the brain and surrounding
structures were obtained without and with intravenous contrast.
CONTRAST:  20mL MULTIHANCE GADOBENATE DIMEGLUMINE 529 MG/ML IV SOLN

[Series 2: T1 · sagittal · 5.0mm · 0.45mm/px · 2 of 25 slices shown (1 of 2)]
[im 1/25]
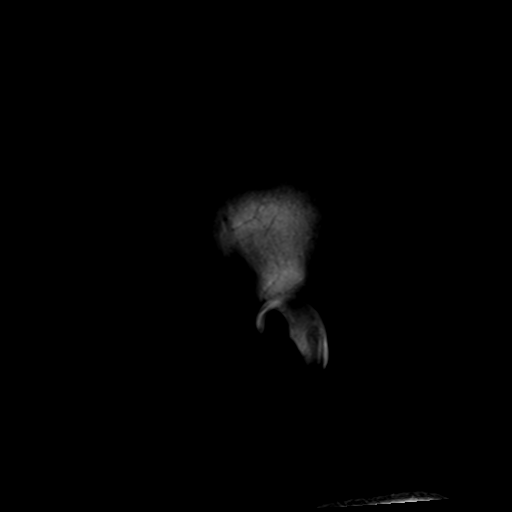
[im 25/25]
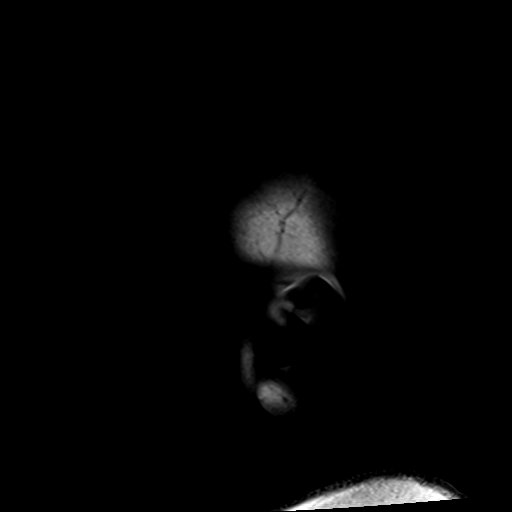

[Series 4: DWI · axial · 3.0mm · 1.20mm/px · z∈[-66,+99]mm · 7 of 55 slices shown (1 of 2)]
[im 1/55]
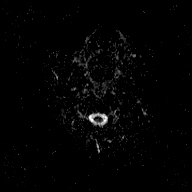
[im 10/55]
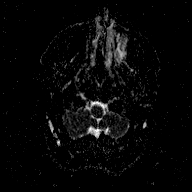
[im 19/55]
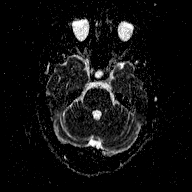
[im 28/55]
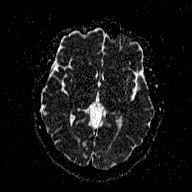
[im 37/55]
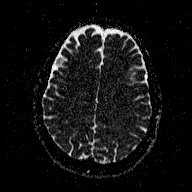
[im 46/55]
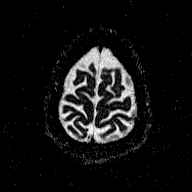
[im 55/55]
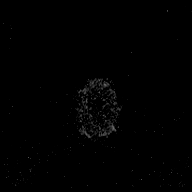

[Series 5: T2 · axial · 5.0mm · 0.72mm/px · z∈[-64,+97]mm · 3 of 24 slices shown (1 of 2)]
[im 1/24]
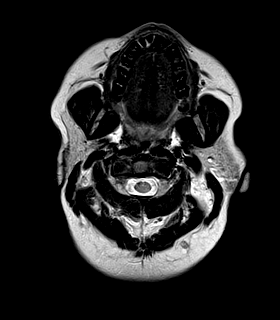
[im 12/24]
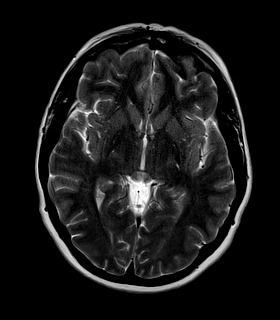
[im 24/24]
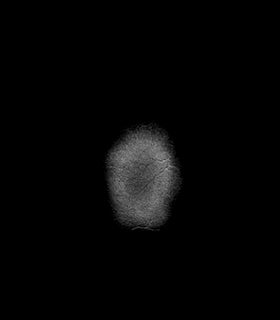

[Series 6: FLAIR · axial · 3.0mm · 0.45mm/px · z∈[-64,+98]mm · 7 of 55 slices shown]
[im 1/55]
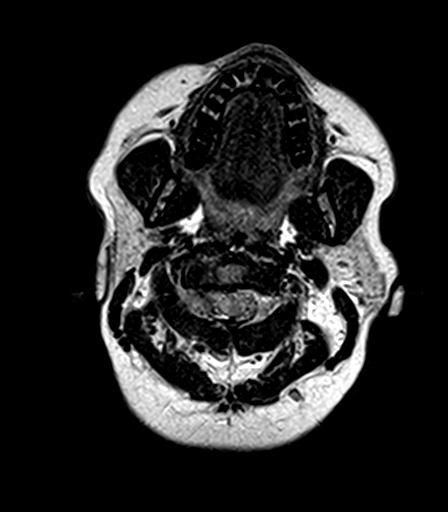
[im 10/55]
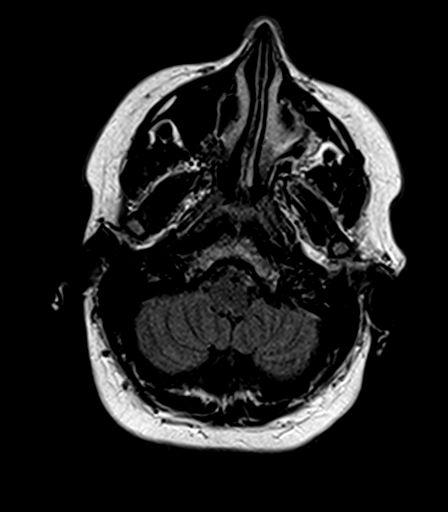
[im 19/55]
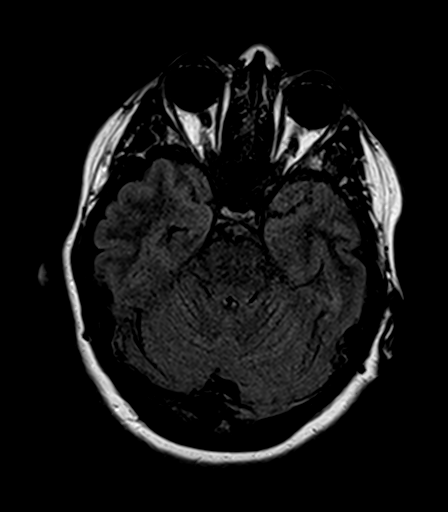
[im 28/55]
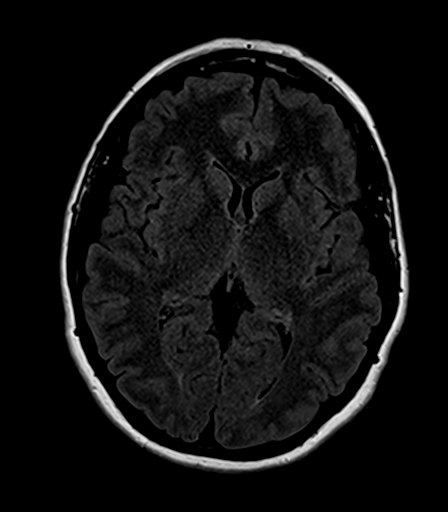
[im 37/55]
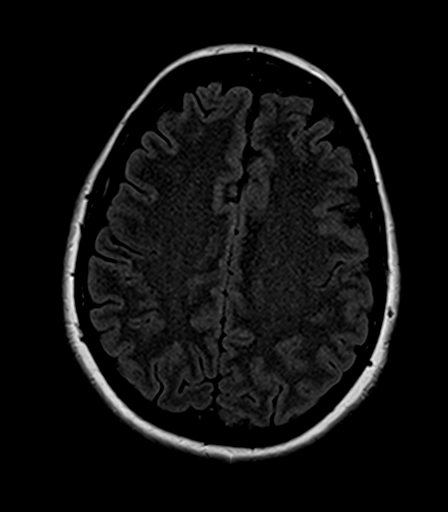
[im 46/55]
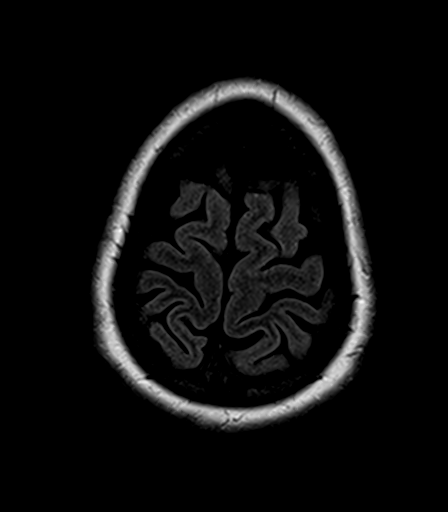
[im 55/55]
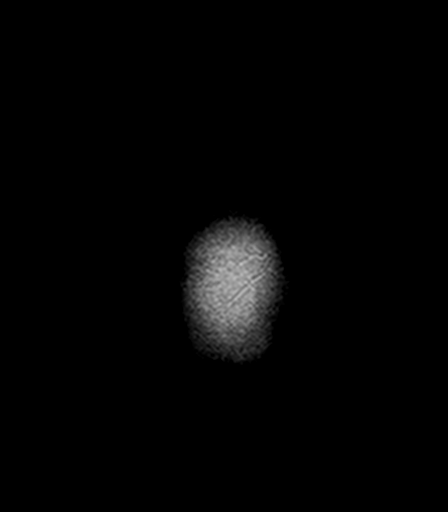

[Series 7: T2 · axial · 5.0mm · 0.72mm/px · z∈[-64,+97]mm · 3 of 24 slices shown (2 of 2)]
[im 1/24]
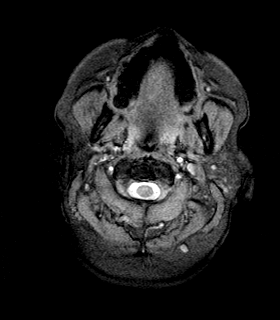
[im 12/24]
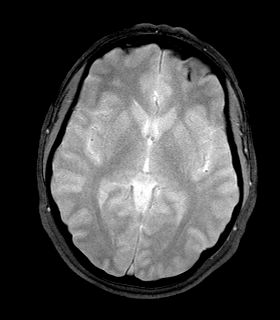
[im 24/24]
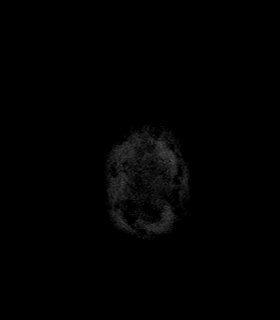

[Series 8: T1 · axial · 1.0mm · 1.00mm/px · z∈[-63,+96]mm · 19 of 160 slices shown (2 of 2)]
[im 1/160]
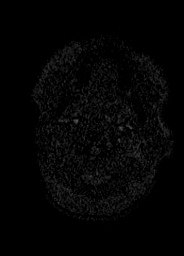
[im 9/160]
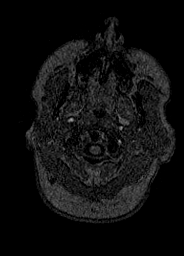
[im 18/160]
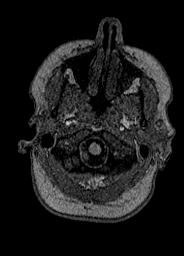
[im 27/160]
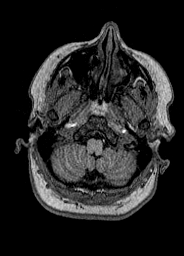
[im 36/160]
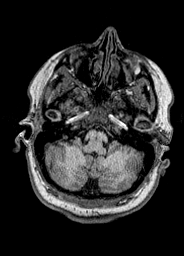
[im 45/160]
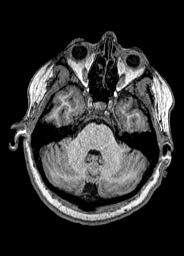
[im 54/160]
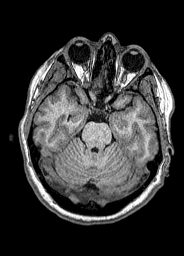
[im 62/160]
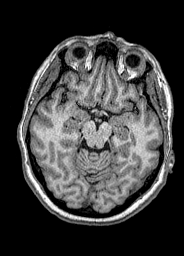
[im 71/160]
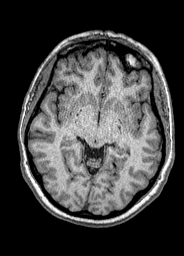
[im 80/160]
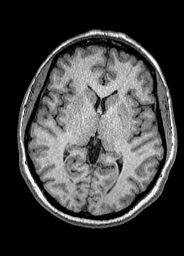
[im 89/160]
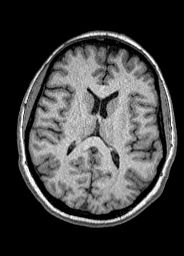
[im 98/160]
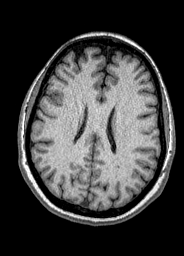
[im 107/160]
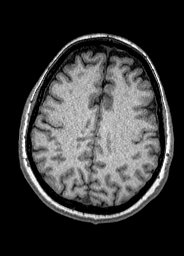
[im 115/160]
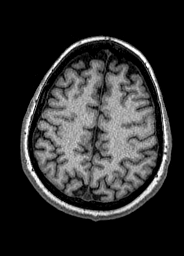
[im 124/160]
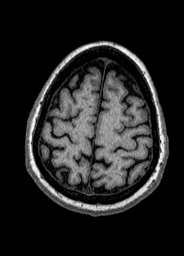
[im 133/160]
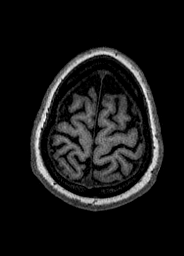
[im 142/160]
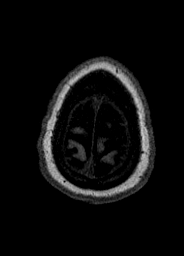
[im 151/160]
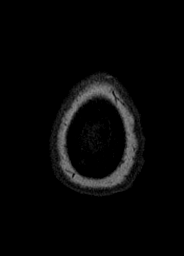
[im 160/160]
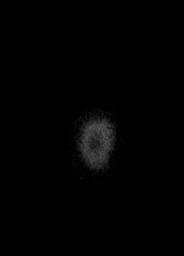

[Series 100: DWI · axial · 3.0mm · 1.20mm/px · z∈[-66,+99]mm · 7 of 56 slices shown (2 of 2)]
[im 1/56]
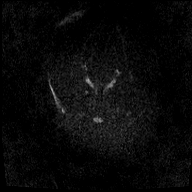
[im 10/56]
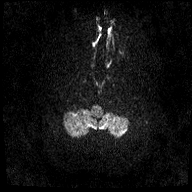
[im 19/56]
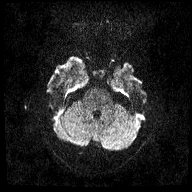
[im 28/56]
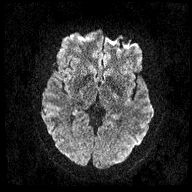
[im 37/56]
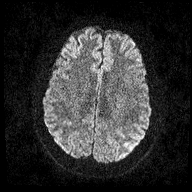
[im 46/56]
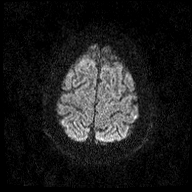
[im 56/56]
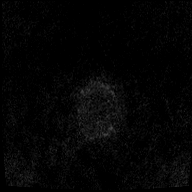

[48 of 48 positions shown; findings below may reference images not displayed]

FINDINGS: Brain: Partially empty sella (series 10, image 19). Overall normal
cerebral volume. No restricted diffusion to suggest acute
infarction. No midline shift, mass effect, evidence of mass lesion,
ventriculomegaly, extra-axial collection or acute intracranial
hemorrhage. Cervicomedullary junction within normal limits.

Gray and white matter signal is within normal limits throughout the
brain. No abnormal enhancement identified. No dural thickening.

Vascular: Major intracranial vascular flow voids are preserved. The
major dural venous sinuses are enhancing and appear patent.

Skull and upper cervical spine: Negative visible cervical spine.
Visualized bone marrow signal is within normal limits.

Sinuses/Orbits: Normal orbits soft tissues. Left maxillary sinus
hypoplasia and mild mucosal thickening (series 10, image 24). Trace
paranasal sinus mucosal thickening elsewhere.

Other: Visible internal auditory structures appear normal. Mastoid
air cells are clear. Scalp and face soft tissues appear negative.
IMPRESSION: 1. Partially empty sella, which can be a normal anatomic variant but
does have an association with idiopathic intracranial hypertension
(pseudotumor cerebri).
2. Otherwise normal MRI appearance of the brain.
3. Chronic appearing left maxillary sinus disease.

## 2020-04-03 ENCOUNTER — Other Ambulatory Visit: Payer: Self-pay | Admitting: Internal Medicine

## 2020-04-03 DIAGNOSIS — F324 Major depressive disorder, single episode, in partial remission: Secondary | ICD-10-CM

## 2020-05-02 NOTE — Progress Notes (Signed)
Date:  05/03/2020   Name:  Camie L. Pullen   DOB:  26-Aug-1975   MRN:  [id number]   Chief Complaint: Annual Exam (Breast exam no pap)  Victoria Dixon is a 45 y.o. female who presents today for her Complete Annual Exam. She feels well. She reports exercising walking dog. She reports she is sleeping well. Breast complaints none.  Mammogram: 2017 DEXA: none Pap smear: 03/2019 negative with cotesting Colonoscopy: none  Immunization History  Administered Date(s) Administered  . Influenza,inj,Quad PF,6+ Mos 01/12/2020  . Influenza-Unspecified 11/22/2017, 11/06/2018  . Moderna Sars-Covid-2 Vaccination 09/11/2019, 10/09/2019  . Tdap 04/25/2016, 05/30/2017    Thyroid Problem Presents for follow-up visit. Patient reports no anxiety, constipation, diarrhea, fatigue, palpitations or tremors. The symptoms have been stable.  Depression        This is a chronic problem.The problem is unchanged.  Associated symptoms include headaches (intermittent respond well to topamax).  Associated symptoms include no fatigue.  Past treatments include SNRIs - Serotonin and norepinephrine reuptake inhibitors.  Past medical history includes thyroid problem.   Migraine  This is a recurrent problem. Pertinent negatives include no abdominal pain, coughing, dizziness, fever, hearing loss, tinnitus or vomiting. Treatments tried: topiramate.    Lab Results  Component Value Date   CREATININE 0.70 04/01/2019   BUN 12 04/01/2019   NA 140 04/01/2019   K 4.5 04/01/2019   CL 106 04/01/2019   CO2 22 04/01/2019   Lab Results  Component Value Date   CHOL 154 04/01/2019   HDL 45 04/01/2019   LDLCALC 88 04/01/2019   TRIG 113 04/01/2019   CHOLHDL 3.4 04/01/2019   Lab Results  Component Value Date   TSH 0.834 04/01/2019   Lab Results  Component Value Date   HGBA1C 5.8 (H) 07/31/2017   Lab Results  Component Value Date   WBC 8.9 04/01/2019   HGB 14.5 04/01/2019   HCT 42.2 04/01/2019   MCV 90  04/01/2019   PLT 223 04/01/2019   Lab Results  Component Value Date   ALT 19 04/01/2019   AST 17 04/01/2019   ALKPHOS 109 04/01/2019   BILITOT 0.5 04/01/2019     Review of Systems  Constitutional: Negative for chills, fatigue and fever.  HENT: Negative for congestion, hearing loss, tinnitus, trouble swallowing and voice change.   Eyes: Negative for visual disturbance.  Respiratory: Negative for cough, chest tightness, shortness of breath and wheezing.   Cardiovascular: Negative for chest pain, palpitations and leg swelling.  Gastrointestinal: Negative for abdominal pain, constipation, diarrhea and vomiting.  Endocrine: Negative for polydipsia and polyuria.  Genitourinary: Negative for dysuria, frequency, genital sores, vaginal bleeding and vaginal discharge.  Musculoskeletal: Negative for arthralgias, gait problem and joint swelling.  Skin: Negative for color change and rash.  Neurological: Positive for headaches (intermittent respond well to topamax). Negative for dizziness, tremors and light-headedness.  Hematological: Negative for adenopathy. Does not bruise/bleed easily.  Psychiatric/Behavioral: Positive for depression. Negative for dysphoric mood and sleep disturbance. The patient is not nervous/anxious.     Patient Active Problem List   Diagnosis Date Noted  . Raynaud's disease without gangrene 04/01/2019  . Vitamin B12 deficiency 07/01/2017  . Migraine variants, not intractable 05/05/2017  . Chronic pain of both knees 05/05/2017  . Major depressive disorder with single episode, in partial remission ([place]) 01/01/2016  . Abnormal mammogram 12/28/2015  . BMI 45.0-49.9, adult ([place]) 12/28/2015  . Environmental and seasonal allergies 08/10/2015  . Hypothyroidism due to acquired atrophy of  thyroid 08/10/2015  . Vitamin D deficiency 08/10/2015  . Generalized anxiety disorder 05/04/2015    Allergies  Allergen Reactions  . Erythromycin   . Azithromycin Nausea Only    And  dizziness    Past Surgical History:  Procedure Laterality Date  . BILATERAL CARPAL TUNNEL RELEASE Bilateral 2010  . LEEP  2002   For abnormal pap, followed by cryosurgery    Social History   Tobacco Use  . Smoking status: Never Smoker  . Smokeless tobacco: Never Used  Substance Use Topics  . Alcohol use: Yes    Alcohol/week: 1.0 standard drink    Types: 1 Standard drinks or equivalent per week    Comment: Rarely  . Drug use: No     Medication list has been reviewed and updated.  Current Meds  Medication Sig  . Acetaminophen (TYLENOL PO) Take by mouth as needed.  Marland Kitchen b complex vitamins capsule Take 1 capsule by mouth daily.  . Cetirizine HCl 10 MG CAPS   . etonogestrel (NEXPLANON) 68 MG IMPL implant 1 each by Subdermal route once.  Marland Kitchen levothyroxine (SYNTHROID) 75 MCG tablet TAKE 1 TABLET (75 MCG TOTAL) BY MOUTH DAILY BEFORE BREAKFAST.  Marland Kitchen topiramate (TOPAMAX) 50 MG tablet Take 1 tablet (50 mg total) by mouth daily.  . TRINTELLIX 10 MG TABS tablet TAKE 1 TABLET BY MOUTH EVERY DAY  . Vitamin D, Ergocalciferol, (DRISDOL) 50000 units CAPS capsule Take 50,000 Units by mouth every 7 (seven) days.    PHQ 2/9 Scores 05/03/2020 01/12/2020 04/01/2019 02/23/2018  PHQ - 2 Score 0 0 0 0  PHQ- 9 Score 0 0 0 7    GAD 7 : Generalized Anxiety Score 05/03/2020 01/12/2020  Nervous, Anxious, on Edge 0 0  Control/stop worrying 0 0  Worry too much - different things 0 0  Trouble relaxing 0 0  Restless 0 0  Easily annoyed or irritable 0 0  Afraid - awful might happen 0 0  Total GAD 7 Score 0 0    BP Readings from Last 3 Encounters:  05/03/20 104/78  01/12/20 122/84  04/01/19 124/70    Physical Exam Vitals and nursing note reviewed.  Constitutional:      General: She is not in acute distress.    Appearance: She is well-developed.  HENT:     Head: Normocephalic and atraumatic.     Right Ear: Tympanic membrane and ear canal normal.     Left Ear: Tympanic membrane and ear canal normal.      Nose:     Right Sinus: No maxillary sinus tenderness.     Left Sinus: No maxillary sinus tenderness.  Eyes:     General: No scleral icterus.       Right eye: No discharge.        Left eye: No discharge.     Conjunctiva/sclera: Conjunctivae normal.  Neck:     Thyroid: No thyromegaly.     Vascular: No carotid bruit.  Cardiovascular:     Rate and Rhythm: Normal rate and regular rhythm.     Pulses: Normal pulses.     Heart sounds: Normal heart sounds.  Pulmonary:     Effort: Pulmonary effort is normal. No respiratory distress.     Breath sounds: No wheezing.  Chest:  Breasts:     Right: No mass, nipple discharge, skin change or tenderness.     Left: No mass, nipple discharge, skin change or tenderness.    Abdominal:     General: Bowel sounds  are normal.     Palpations: Abdomen is soft.     Tenderness: There is no abdominal tenderness.  Musculoskeletal:     Cervical back: Normal range of motion. No erythema.     Right lower leg: No edema.     Left lower leg: No edema.  Lymphadenopathy:     Cervical: No cervical adenopathy.  Skin:    General: Skin is warm and dry.     Capillary Refill: Capillary refill takes less than 2 seconds.     Findings: No rash.  Neurological:     General: No focal deficit present.     Mental Status: She is alert and oriented to person, place, and time.     Cranial Nerves: No cranial nerve deficit.     Sensory: No sensory deficit.     Deep Tendon Reflexes: Reflexes are normal and symmetric.  Psychiatric:        Attention and Perception: Attention normal.        Mood and Affect: Mood normal.        Behavior: Behavior normal.     Wt Readings from Last 3 Encounters:  05/03/20 288 lb (130.6 kg)  01/12/20 297 lb (134.7 kg)  04/01/19 298 lb (135.2 kg)    BP 104/78   Pulse 94   Temp 98.2 F (36.8 C) (Oral)   Ht 5' 4"  (1.626 m)   Wt 288 lb (130.6 kg)   SpO2 97%   BMI 49.44 kg/m   Assessment and Plan: 1. Annual physical exam Exam is  normal except for weight. Encourage regular exercise and appropriate dietary changes. She is losing gradually weight with calorie restriction. - CBC with Differential/Platelet - Comprehensive metabolic panel - Lipid panel  2. Hypothyroidism due to acquired atrophy of thyroid supplemented - TSH + free T4 - levothyroxine (SYNTHROID) 75 MCG tablet; Take 1 tablet (75 mcg total) by mouth daily before breakfast.  Dispense: 90 tablet; Refill: 1  3. Migraine variants, not intractable Continue Topamax daily  4. Major depressive disorder with single episode, in partial remission (HCC) Clinically stable on current regimen with good control of symptoms, No SI or HI. Will continue current therapy.  5. Need for hepatitis C screening test - Hepatitis C antibody  6. BMI 45.0-49.9, adult (HCC) Continue current calorie restriction  7. Encounter for screening mammogram for breast cancer Pt to schedule at [place]; Future   Partially dictated using Editor, commissioning. Any errors are unintentional.  Halina Maidens, MD [facility]  05/03/2020

## 2020-05-03 ENCOUNTER — Other Ambulatory Visit: Payer: Self-pay

## 2020-05-03 ENCOUNTER — Ambulatory Visit (INDEPENDENT_AMBULATORY_CARE_PROVIDER_SITE_OTHER): Payer: BC Managed Care – PPO | Admitting: Internal Medicine

## 2020-05-03 ENCOUNTER — Encounter: Payer: Self-pay | Admitting: Internal Medicine

## 2020-05-03 VITALS — BP 104/78 | HR 94 | Temp 98.2°F | Ht 64.0 in | Wt 288.0 lb

## 2020-05-03 DIAGNOSIS — E034 Atrophy of thyroid (acquired): Secondary | ICD-10-CM

## 2020-05-03 DIAGNOSIS — Z6841 Body Mass Index (BMI) 40.0 and over, adult: Secondary | ICD-10-CM

## 2020-05-03 DIAGNOSIS — Z0000 Encounter for general adult medical examination without abnormal findings: Secondary | ICD-10-CM | POA: Diagnosis not present

## 2020-05-03 DIAGNOSIS — G43809 Other migraine, not intractable, without status migrainosus: Secondary | ICD-10-CM | POA: Diagnosis not present

## 2020-05-03 DIAGNOSIS — F324 Major depressive disorder, single episode, in partial remission: Secondary | ICD-10-CM

## 2020-05-03 DIAGNOSIS — Z1159 Encounter for screening for other viral diseases: Secondary | ICD-10-CM | POA: Diagnosis not present

## 2020-05-03 DIAGNOSIS — Z1231 Encounter for screening mammogram for malignant neoplasm of breast: Secondary | ICD-10-CM

## 2020-05-03 MED ORDER — LEVOTHYROXINE SODIUM 75 MCG PO TABS: 75.0000 ug | ORAL_TABLET | Freq: Every day | ORAL | 1 refills | Status: DC

## 2020-05-04 LAB — COMPREHENSIVE METABOLIC PANEL
ALT: 15 IU/L (ref 0–32)
AST: 18 IU/L (ref 0–40)
Albumin/Globulin Ratio: 1.8 (ref 1.2–2.2)
Albumin: 4.3 g/dL (ref 3.8–4.8)
Alkaline Phosphatase: 90 IU/L (ref 44–121)
BUN/Creatinine Ratio: 19 (ref 9–23)
BUN: 13 mg/dL (ref 6–24)
Bilirubin Total: 0.5 mg/dL (ref 0.0–1.2)
CO2: 20 mmol/L (ref 20–29)
Calcium: 9.5 mg/dL (ref 8.7–10.2)
Chloride: 105 mmol/L (ref 96–106)
Creatinine, Ser: 0.69 mg/dL (ref 0.57–1.00)
Globulin, Total: 2.4 g/dL (ref 1.5–4.5)
Glucose: 96 mg/dL (ref 65–99)
Potassium: 4.2 mmol/L (ref 3.5–5.2)
Sodium: 139 mmol/L (ref 134–144)
Total Protein: 6.7 g/dL (ref 6.0–8.5)
eGFR: 110 mL/min/{1.73_m2} (ref >59)

## 2020-05-04 LAB — CBC WITH DIFFERENTIAL/PLATELET
Basophils Absolute: 0.1 10*3/uL (ref 0.0–0.2)
Basos: 1 %
EOS (ABSOLUTE): 0.1 10*3/uL (ref 0.0–0.4)
Eos: 2 %
Hematocrit: 43.9 % (ref 34.0–46.6)
Hemoglobin: 14.7 g/dL (ref 11.1–15.9)
Immature Grans (Abs): 0 10*3/uL (ref 0.0–0.1)
Immature Granulocytes: 0 %
Lymphocytes Absolute: 2.4 10*3/uL (ref 0.7–3.1)
Lymphs: 31 %
MCH: 30.3 pg (ref 26.6–33.0)
MCHC: 33.5 g/dL (ref 31.5–35.7)
MCV: 91 fL (ref 79–97)
Monocytes Absolute: 0.4 10*3/uL (ref 0.1–0.9)
Monocytes: 5 %
Neutrophils Absolute: 4.7 10*3/uL (ref 1.4–7.0)
Neutrophils: 61 %
Platelets: 227 10*3/uL (ref 150–450)
RBC: 4.85 x10E6/uL (ref 3.77–5.28)
RDW: 12.4 % (ref 11.7–15.4)
WBC: 7.7 10*3/uL (ref 3.4–10.8)

## 2020-05-04 LAB — LIPID PANEL
Chol/HDL Ratio: 4 ratio (ref 0.0–4.4)
Cholesterol, Total: 165 mg/dL (ref 100–199)
HDL: 41 mg/dL (ref >39)
LDL Chol Calc (NIH): 102 mg/dL — ABNORMAL HIGH (ref 0–99)
Triglycerides: 121 mg/dL (ref 0–149)
VLDL Cholesterol Cal: 22 mg/dL (ref 5–40)

## 2020-05-04 LAB — HEPATITIS C ANTIBODY: Hep C Virus Ab: 0.1 s/co ratio (ref 0.0–0.9)

## 2020-05-04 LAB — TSH+FREE T4
Free T4: 1.33 ng/dL (ref 0.82–1.77)
TSH: 0.58 u[IU]/mL (ref 0.450–4.500)

## 2020-05-04 NOTE — Progress Notes (Signed)
Patient notified and verbalized understanding.  No further questions at this time.

## 2020-07-03 ENCOUNTER — Other Ambulatory Visit: Payer: Self-pay | Admitting: Internal Medicine

## 2020-07-03 DIAGNOSIS — F324 Major depressive disorder, single episode, in partial remission: Secondary | ICD-10-CM

## 2020-07-03 NOTE — Telephone Encounter (Signed)
Requested medication (s) are due for refill today:   Yes  Requested medication (s) are on the active medication list:   Yes  Future visit scheduled:   Yes   Last ordered: 04/03/2020 #90, 0 refills  Returned because pharmacy is requesting an alternative.   Requested Prescriptions  Pending Prescriptions Disp Refills   TRINTELLIX 10 MG TABS tablet [Pharmacy Med Name: [place] 10 MG TABLET] 90 tablet 0    Sig: TAKE 1 TABLET BY MOUTH EVERY DAY      Psychiatry: Antidepressants - Serotonin Modulator Passed - 07/03/2020  1:35 AM      Passed - Completed PHQ-2 or PHQ-9 in the last 360 days      Passed - Valid encounter within last 6 months    Recent Outpatient Visits           2 months ago Annual physical exam   [facility] Glean Hess, MD   5 months ago Migraine variants, not intractable   [facility] Glean Hess, MD   1 year ago Annual physical exam   [facility] Glean Hess, MD   2 years ago Hypothyroidism due to acquired atrophy of thyroid   [facility] Glean Hess, MD   2 years ago Major depressive disorder with single episode, in partial remission [facility])   [facility] Glean Hess, MD       Future Appointments             In 3 months Army Melia Jesse Sans, MD [facility] [place]   In 10 months Army Melia Jesse Sans, MD [facility]

## 2020-07-14 DIAGNOSIS — K0499 Other diseases of pulp and periapical tissues: Secondary | ICD-10-CM | POA: Diagnosis not present

## 2020-10-03 DIAGNOSIS — Z1231 Encounter for screening mammogram for malignant neoplasm of breast: Secondary | ICD-10-CM | POA: Diagnosis not present

## 2020-10-15 ENCOUNTER — Other Ambulatory Visit: Payer: Self-pay | Admitting: Internal Medicine

## 2020-10-15 DIAGNOSIS — F324 Major depressive disorder, single episode, in partial remission: Secondary | ICD-10-CM

## 2020-10-23 ENCOUNTER — Ambulatory Visit: Payer: BC Managed Care – PPO | Admitting: Internal Medicine

## 2020-10-23 ENCOUNTER — Other Ambulatory Visit: Payer: Self-pay

## 2020-10-23 ENCOUNTER — Encounter: Payer: Self-pay | Admitting: Internal Medicine

## 2020-10-23 VITALS — BP 130/74 | HR 77 | Temp 98.4°F | Ht 64.0 in | Wt 276.0 lb

## 2020-10-23 DIAGNOSIS — F324 Major depressive disorder, single episode, in partial remission: Secondary | ICD-10-CM

## 2020-10-23 DIAGNOSIS — G43809 Other migraine, not intractable, without status migrainosus: Secondary | ICD-10-CM

## 2020-10-23 DIAGNOSIS — J029 Acute pharyngitis, unspecified: Secondary | ICD-10-CM

## 2020-10-23 DIAGNOSIS — E034 Atrophy of thyroid (acquired): Secondary | ICD-10-CM | POA: Diagnosis not present

## 2020-10-23 MED ORDER — LEVOTHYROXINE SODIUM 75 MCG PO TABS: 75.0000 ug | ORAL_TABLET | Freq: Every day | ORAL | 3 refills | Status: DC

## 2020-10-23 NOTE — Progress Notes (Signed)
Date:  10/23/2020   Name:  Victoria Dixon   DOB:  [birth year]   MRN:  [id number]   Chief Complaint: Hypothyroidism, Depression, and Migraine  Thyroid Problem Presents for follow-up visit. Symptoms include diarrhea. Patient reports no anxiety or fatigue. The symptoms have been stable.  Depression        This is a chronic problem.  The problem has been resolved since onset.  Associated symptoms include headaches.  Associated symptoms include no fatigue.  Past treatments include SNRIs - Serotonin and norepinephrine reuptake inhibitors.  Compliance with treatment is good.  Past medical history includes thyroid problem.   Migraine  This is a recurrent problem. Episode frequency: every 6 weeks. The pain does not radiate. The pain quality is similar to prior headaches. Associated symptoms include a sore throat. Pertinent negatives include no coughing or fever. Treatments tried: topamax. The treatment provided significant relief.  Sore Throat  This is a new problem. The current episode started yesterday. The problem has been gradually worsening. Neither side of throat is experiencing more pain than the other. There has been no fever. The pain is at a severity of 4/10. The pain is mild. Associated symptoms include diarrhea and headaches. Pertinent negatives include no congestion, coughing, shortness of breath or trouble swallowing. She has tried gargles for the symptoms. The treatment provided no relief.   Lab Results  Component Value Date   CREATININE 0.69 05/03/2020   BUN 13 05/03/2020   NA 139 05/03/2020   K 4.2 05/03/2020   CL 105 05/03/2020   CO2 20 05/03/2020   Lab Results  Component Value Date   CHOL 165 05/03/2020   HDL 41 05/03/2020   LDLCALC 102 (H) 05/03/2020   TRIG 121 05/03/2020   CHOLHDL 4.0 05/03/2020   Lab Results  Component Value Date   TSH 0.580 05/03/2020   Lab Results  Component Value Date   HGBA1C 5.8 (H) 07/31/2017   Lab Results  Component Value Date    WBC 7.7 05/03/2020   HGB 14.7 05/03/2020   HCT 43.9 05/03/2020   MCV 91 05/03/2020   PLT 227 05/03/2020   Lab Results  Component Value Date   ALT 15 05/03/2020   AST 18 05/03/2020   ALKPHOS 90 05/03/2020   BILITOT 0.5 05/03/2020     Review of Systems  Constitutional:  Negative for chills, fatigue and fever.  HENT:  Positive for sore throat. Negative for congestion, postnasal drip, sinus pain and trouble swallowing.   Respiratory:  Negative for cough, chest tightness and shortness of breath.   Cardiovascular:  Positive for leg swelling (intermittently). Negative for chest pain.  Gastrointestinal:  Positive for diarrhea. Negative for blood in stool.  Neurological:  Positive for headaches.  Psychiatric/Behavioral:  Positive for depression. Negative for dysphoric mood and sleep disturbance. The patient is not nervous/anxious.    Patient Active Problem List   Diagnosis Date Noted   Raynaud's disease without gangrene 04/01/2019   Vitamin B12 deficiency 07/01/2017   Migraine variants, not intractable 05/05/2017   Chronic pain of both knees 05/05/2017   Major depressive disorder with single episode, in partial remission ([place]) 01/01/2016   Abnormal mammogram 12/28/2015   BMI 45.0-49.9, adult ([place]) 12/28/2015   Environmental and seasonal allergies 08/10/2015   Hypothyroidism due to acquired atrophy of thyroid 08/10/2015   Vitamin D deficiency 08/10/2015   Generalized anxiety disorder 05/04/2015    Allergies  Allergen Reactions   Erythromycin    Azithromycin Nausea Only  And dizziness    Past Surgical History:  Procedure Laterality Date   BILATERAL CARPAL TUNNEL RELEASE Bilateral 2010   LEEP  2002   For abnormal pap, followed by cryosurgery    Social History   Tobacco Use   Smoking status: Never   Smokeless tobacco: Never  Substance Use Topics   Alcohol use: Yes    Alcohol/week: 1.0 standard drink    Types: 1 Standard drinks or equivalent per week    Comment:  Rarely   Drug use: No     Medication list has been reviewed and updated.  Current Meds  Medication Sig   Acetaminophen (TYLENOL PO) Take by mouth as needed.   b complex vitamins capsule Take 1 capsule by mouth daily.   Cetirizine HCl 10 MG CAPS    etonogestrel (NEXPLANON) 68 MG IMPL implant 1 each by Subdermal route once.   levothyroxine (SYNTHROID) 75 MCG tablet Take 1 tablet (75 mcg total) by mouth daily before breakfast.   topiramate (TOPAMAX) 50 MG tablet Take 1 tablet (50 mg total) by mouth daily.   TRINTELLIX 10 MG TABS tablet TAKE 1 TABLET BY MOUTH EVERY DAY   Vitamin D, Ergocalciferol, (DRISDOL) 50000 units CAPS capsule Take 50,000 Units by mouth every 7 (seven) days.    PHQ 2/9 Scores 10/23/2020 05/03/2020 01/12/2020 04/01/2019  PHQ - 2 Score 0 0 0 0  PHQ- 9 Score 3 0 0 0    GAD 7 : Generalized Anxiety Score 10/23/2020 05/03/2020 01/12/2020  Nervous, Anxious, on Edge 0 0 0  Control/stop worrying 0 0 0  Worry too much - different things 0 0 0  Trouble relaxing 0 0 0  Restless 0 0 0  Easily annoyed or irritable 0 0 0  Afraid - awful might happen 0 0 0  Total GAD 7 Score 0 0 0  Anxiety Difficulty Not difficult at all - -    BP Readings from Last 3 Encounters:  10/23/20 130/74  05/03/20 104/78  01/12/20 122/84    Physical Exam Vitals and nursing note reviewed.  Constitutional:      General: She is not in acute distress.    Appearance: Normal appearance. She is well-developed.  HENT:     Head: Normocephalic and atraumatic.     Right Ear: Tympanic membrane and ear canal normal.     Left Ear: Tympanic membrane and ear canal normal.     Nose:     Right Sinus: No maxillary sinus tenderness or frontal sinus tenderness.     Left Sinus: No maxillary sinus tenderness or frontal sinus tenderness.     Mouth/Throat:     Mouth: Mucous membranes are moist.     Tongue: No lesions.     Palate: No lesions.     Pharynx: Uvula midline.     Tonsils: No tonsillar exudate. 3+ on  the right. 3+ on the left.     Comments: Shallow ulcer on uvula  Cardiovascular:     Rate and Rhythm: Normal rate and regular rhythm.     Pulses: Normal pulses.     Heart sounds: No murmur heard. Pulmonary:     Effort: Pulmonary effort is normal. No respiratory distress.     Breath sounds: Normal breath sounds. No decreased breath sounds, wheezing or rhonchi.  Musculoskeletal:     Right lower leg: No edema.     Left lower leg: No edema.  Skin:    General: Skin is warm and dry.     Findings:  No rash.  Neurological:     General: No focal deficit present.     Mental Status: She is alert and oriented to person, place, and time.  Psychiatric:        Mood and Affect: Mood normal.        Behavior: Behavior normal.    Wt Readings from Last 3 Encounters:  10/23/20 276 lb (125.2 kg)  05/03/20 288 lb (130.6 kg)  01/12/20 297 lb (134.7 kg)    BP 130/74   Pulse 77   Temp 98.4 F (36.9 C) (Oral)   Ht 5' 4"  (1.626 m)   Wt 276 lb (125.2 kg)   SpO2 98%   BMI 47.38 kg/m   Assessment and Plan: 1. Hypothyroidism due to acquired atrophy of thyroid Supplemented; no change in dose at this time - levothyroxine (SYNTHROID) 75 MCG tablet; Take 1 tablet (75 mcg total) by mouth daily before breakfast.  Dispense: 90 tablet; Refill: 3  2. Migraine variants, not intractable Clinically stable without change in character or frequency of migraine headaches Headaches respond well to current therapy with topamax. Will continue current plan, follow up if worsening.  3. Major depressive disorder with single episode, in partial remission (HCC) Clinically stable on current regimen with good control of symptoms, No SI or HI. Will continue current therapy.  4. Pharyngitis, unspecified etiology Likely viral - recommend gargles, tylenol and rest Call for antibiotics if sx of sinusitis develop   Partially dictated using Dragon software. Any errors are unintentional.  Halina Maidens, MD [facility]  10/23/2020

## 2020-11-14 ENCOUNTER — Encounter: Payer: Self-pay | Admitting: Internal Medicine

## 2020-11-14 ENCOUNTER — Other Ambulatory Visit (HOSPITAL_COMMUNITY): Payer: Self-pay

## 2020-11-14 MED ORDER — INFLUENZA VAC SPLIT QUAD 0.5 ML IM SUSY
PREFILLED_SYRINGE | INTRAMUSCULAR | 0 refills | Status: DC
  Filled 2020-11-14: qty 0.5, 1d supply, fill #0

## 2021-01-10 ENCOUNTER — Other Ambulatory Visit: Payer: Self-pay | Admitting: Internal Medicine

## 2021-01-10 DIAGNOSIS — G43809 Other migraine, not intractable, without status migrainosus: Secondary | ICD-10-CM

## 2021-01-10 DIAGNOSIS — F324 Major depressive disorder, single episode, in partial remission: Secondary | ICD-10-CM

## 2021-01-10 NOTE — Telephone Encounter (Signed)
Requested medications are due for refill today.  yes  Requested medications are on the active medications list.  yes  Last refill. 01/12/2020  Future visit scheduled.   yes  Notes to clinic.  Medication not delegated.    Requested Prescriptions  Pending Prescriptions Disp Refills   topiramate (TOPAMAX) 50 MG tablet [Pharmacy Med Name: TOPIRAMATE 50 MG TABLET] 90 tablet 3    Sig: TAKE 1 TABLET BY MOUTH EVERY DAY     Not Delegated - Neurology: Anticonvulsants - topiramate & zonisamide Failed - 01/10/2021  3:57 AM      Failed - This refill cannot be delegated      Passed - Cr in normal range and within 360 days    Creatinine, Ser  Date Value Ref Range Status  05/03/2020 0.69 0.57 - 1.00 mg/dL Final          Passed - CO2 in normal range and within 360 days    CO2  Date Value Ref Range Status  05/03/2020 20 20 - 29 mmol/L Final          Passed - Valid encounter within last 12 months    Recent Outpatient Visits           2 months ago Hypothyroidism due to acquired atrophy of thyroid   [facility] Glean Hess, [facility]   8 months ago Annual physical exam   [facility] Glean Hess, MD   12 months ago Migraine variants, not intractable   [facility] Glean Hess, MD   1 year ago Annual physical exam   [facility] Glean Hess, [facility]   2 years ago Hypothyroidism due to acquired atrophy of thyroid   [facility] Glean Hess, MD       Future Appointments             In 3 months Army Melia Jesse Sans, MD [facility], [facility]            Signed Prescriptions Disp Refills   TRINTELLIX 10 MG TABS tablet 90 tablet 0    Sig: TAKE 1 TABLET BY [place]     Psychiatry: Antidepressants - Serotonin Modulator Passed - 01/10/2021  3:57 AM      Passed - Completed PHQ-2 or PHQ-9 in the last 360 days      Passed - Valid encounter within last 6 months    Recent Outpatient Visits           2 months  ago Hypothyroidism due to acquired atrophy of thyroid   [facility] Glean Hess, MD   8 months ago Annual physical exam   [facility] Glean Hess, MD   12 months ago Migraine variants, not intractable   [facility] Glean Hess, MD   1 year ago Annual physical exam   [facility] Glean Hess, MD   2 years ago Hypothyroidism due to acquired atrophy of thyroid   [facility] Glean Hess, MD       Future Appointments             In 3 months Army Melia Jesse Sans, MD [facility], [facility]

## 2021-01-10 NOTE — Telephone Encounter (Signed)
Requested Prescriptions  Pending Prescriptions Disp Refills  . topiramate (TOPAMAX) 50 MG tablet [Pharmacy Med Name: TOPIRAMATE 50 MG TABLET] 90 tablet 3    Sig: TAKE 1 TABLET BY MOUTH EVERY DAY     Not Delegated - Neurology: Anticonvulsants - topiramate & zonisamide Failed - 01/10/2021  3:57 AM      Failed - This refill cannot be delegated      Passed - Cr in normal range and within 360 days    Creatinine, Ser  Date Value Ref Range Status  05/03/2020 0.69 0.57 - 1.00 mg/dL Final         Passed - CO2 in normal range and within 360 days    CO2  Date Value Ref Range Status  05/03/2020 20 20 - 29 mmol/L Final         Passed - Valid encounter within last 12 months    Recent Outpatient Visits          2 months ago Hypothyroidism due to acquired atrophy of thyroid   [facility] Glean Hess, MD   8 months ago Annual physical exam   [facility] Glean Hess, MD   12 months ago Migraine variants, not intractable   [facility] Glean [facility], [facility]   1 year ago Annual physical exam   [facility] Glean Hess, MD   2 years ago Hypothyroidism due to acquired atrophy of thyroid   [facility] Glean Hess, MD      Future Appointments            In 3 months Army Melia Jesse Sans, MD [facility], [place]           . TRINTELLIX 10 MG TABS tablet [Pharmacy Med Name: [place] 10 MG TABLET] 90 tablet 0    Sig: TAKE 1 TABLET BY MOUTH EVERY DAY     Psychiatry: Antidepressants - Serotonin Modulator Passed - 01/10/2021  3:57 AM      Passed - Completed PHQ-2 or PHQ-9 in the last 360 days      Passed - Valid encounter within last 6 months    Recent Outpatient Visits          2 months ago Hypothyroidism due to acquired atrophy of thyroid   [facility] Glean Hess, MD   8 months ago Annual physical exam   [facility] Glean Hess, MD   12 months ago Migraine variants, not intractable    [facility] Glean Hess, MD   1 year ago Annual physical exam   [facility] Glean Hess, MD   2 years ago Hypothyroidism due to acquired atrophy of thyroid   [facility] Glean Hess, MD      Future Appointments            In 3 months Army Melia Jesse Sans, MD [facility], [facility]

## 2021-04-13 ENCOUNTER — Other Ambulatory Visit: Payer: Self-pay | Admitting: Internal Medicine

## 2021-04-13 DIAGNOSIS — G43809 Other migraine, not intractable, without status migrainosus: Secondary | ICD-10-CM

## 2021-04-13 DIAGNOSIS — F324 Major depressive disorder, single episode, in partial remission: Secondary | ICD-10-CM

## 2021-04-15 NOTE — Telephone Encounter (Signed)
Requested Prescriptions  ?Pending Prescriptions Disp Refills  ?? topiramate (TOPAMAX) 50 MG tablet [Pharmacy Med Name: TOPIRAMATE 50 MG TABLET] 90 tablet 0  ?  Sig: TAKE 1 TABLET BY MOUTH EVERY DAY  ?  ? Neurology: Anticonvulsants - topiramate & zonisamide Passed - 04/13/2021  7:12 PM  ?  ?  Passed - Cr in normal range and within 360 days  ?  Creatinine, Ser  ?Date Value Ref Range Status  ?05/03/2020 0.69 0.57 - 1.00 mg/dL Final  ?   ?  ?  Passed - CO2 in normal range and within 360 days  ?  CO2  ?Date Value Ref Range Status  ?05/03/2020 20 20 - 29 mmol/L Final  ?   ?  ?  Passed - ALT in normal range and within 360 days  ?  ALT  ?Date Value Ref Range Status  ?05/03/2020 15 0 - 32 IU/L Final  ?   ?  ?  Passed - AST in normal range and within 360 days  ?  AST  ?Date Value Ref Range Status  ?05/03/2020 18 0 - 40 IU/L Final  ?   ?  ?  Passed - Completed PHQ-2 or PHQ-9 in the last 360 days  ?  ?  Passed - Valid encounter within last 12 months  ?  Recent Outpatient Visits   ?      ? 5 months ago Hypothyroidism due to acquired atrophy of thyroid  ? [facility] Glean Hess, MD  ? 11 months ago Annual physical exam  ? [facility] Glean Hess, MD  ? 1 year ago Migraine variants, not intractable  ? [facility] Glean Hess, MD  ? 2 years ago Annual physical exam  ? [facility] Glean Hess, MD  ? 3 years ago Hypothyroidism due to acquired atrophy of thyroid  ? [facility] Glean Hess, MD  ?  ?  ?Future Appointments   ?        ? In 3 weeks Army Melia Jesse Sans, MD [facility], [facility]  ?  ? ?  ?  ?  ?? TRINTELLIX 10 MG TABS tablet [Pharmacy Med Name: TRINTELLIX 10 MG TABLET] 90 tablet 0  ?  Sig: TAKE 1 TABLET BY MOUTH EVERY DAY  ?  ? Psychiatry: Antidepressants - Serotonin Modulator Passed - 04/13/2021  7:12 PM  ?  ?  Passed - Completed PHQ-2 or PHQ-9 in the last 360 days  ?  ?  Passed - Valid encounter within last 6 months  ?  Recent Outpatient  Visits   ?      ? 5 months ago Hypothyroidism due to acquired atrophy of thyroid  ? [facility] Glean Hess, MD  ? 11 months ago Annual physical exam  ? [facility] Glean Hess, MD  ? 1 year ago Migraine variants, not intractable  ? [facility] Glean Hess, MD  ? 2 years ago Annual physical exam  ? [facility] Glean Hess, MD  ? 3 years ago Hypothyroidism due to acquired atrophy of thyroid  ? [facility] Glean Hess, MD  ?  ?  ?Future Appointments   ?        ? In 3 weeks Glean Hess, MD [facility], [place]  ?  ? ?  ?  ?  ? ?

## 2021-05-06 ENCOUNTER — Encounter: Payer: Self-pay | Admitting: Internal Medicine

## 2021-05-06 ENCOUNTER — Ambulatory Visit (INDEPENDENT_AMBULATORY_CARE_PROVIDER_SITE_OTHER): Payer: BC Managed Care – PPO | Admitting: Internal Medicine

## 2021-05-06 VITALS — BP 122/78 | HR 74 | Ht 64.0 in | Wt 285.0 lb

## 2021-05-06 DIAGNOSIS — Z0000 Encounter for general adult medical examination without abnormal findings: Secondary | ICD-10-CM | POA: Diagnosis not present

## 2021-05-06 DIAGNOSIS — E034 Atrophy of thyroid (acquired): Secondary | ICD-10-CM | POA: Diagnosis not present

## 2021-05-06 DIAGNOSIS — Z1211 Encounter for screening for malignant neoplasm of colon: Secondary | ICD-10-CM

## 2021-05-06 DIAGNOSIS — G43809 Other migraine, not intractable, without status migrainosus: Secondary | ICD-10-CM

## 2021-05-06 DIAGNOSIS — F324 Major depressive disorder, single episode, in partial remission: Secondary | ICD-10-CM

## 2021-05-06 DIAGNOSIS — Z6841 Body Mass Index (BMI) 40.0 and over, adult: Secondary | ICD-10-CM

## 2021-05-06 DIAGNOSIS — Z1231 Encounter for screening mammogram for malignant neoplasm of breast: Secondary | ICD-10-CM

## 2021-05-06 DIAGNOSIS — R7303 Prediabetes: Secondary | ICD-10-CM | POA: Diagnosis not present

## 2021-05-06 NOTE — Progress Notes (Signed)
? ? ?Date:  05/06/2021  ? ?Name:  Victoria Dixon   DOB:  1975/02/10   MRN:  [id number] ? ? ?Chief Complaint: Annual Exam ?Victoria Dixon is a 46 y.o. female who presents today for her Complete Annual Exam. She feels well. She reports exercising- none. She reports she is sleeping poorly. Breast complaints - none. ? ?Mammogram: 09/2020 ?DEXA: none ?Pap smear: 03/2019 neg with co-testing ?Colonoscopy: none - due ? ?Health Maintenance Due  ?Topic Date Due  ? COLONOSCOPY (Pts 45-3yr Insurance coverage will need to be confirmed)  Never done  ?  ?Immunization History  ?Administered Date(s) Administered  ? Influenza,inj,Quad PF,6+ Mos 01/12/2020, 11/14/2020  ? Influenza-Unspecified 11/22/2017, 11/06/2018, 11/14/2020  ? Moderna Sars-Covid-2 Vaccination 09/11/2019, 10/09/2019  ? Tdap 04/25/2016, 05/30/2017  ? ? ?Thyroid Problem ?Presents for follow-up visit. Symptoms include anxiety. Patient reports no constipation, diarrhea, fatigue, palpitations or tremors. The symptoms have been stable.  ?Migraine  ?This is a recurrent problem. The problem has been gradually improving. Pertinent negatives include no abdominal pain, coughing, dizziness, fever, hearing loss, tinnitus or vomiting. Treatments tried: topiramate. The treatment provided significant relief.  ?Depression ?       This is a chronic problem.The problem is unchanged.  Associated symptoms include no fatigue and no headaches.  Past treatments include SNRIs - Serotonin and norepinephrine reuptake inhibitors (trintellix).  Compliance with treatment is good (more anxiety).  Past medical history includes thyroid problem.   ? ?Lab Results  ?Component Value Date  ? NA 139 05/03/2020  ? K 4.2 05/03/2020  ? CO2 20 05/03/2020  ? GLUCOSE 96 05/03/2020  ? BUN 13 05/03/2020  ? CREATININE 0.69 05/03/2020  ? CALCIUM 9.5 05/03/2020  ? EGFR 110 05/03/2020  ? GFRNONAA 106 04/01/2019  ? ?Lab Results  ?Component Value Date  ? CHOL 165 05/03/2020  ? HDL 41 05/03/2020  ? LDLCALC 102 (H)  05/03/2020  ? TRIG 121 05/03/2020  ? CHOLHDL 4.0 05/03/2020  ? ?Lab Results  ?Component Value Date  ? TSH 0.580 05/03/2020  ? ?Lab Results  ?Component Value Date  ? HGBA1C 5.8 (H) 07/31/2017  ? ?Lab Results  ?Component Value Date  ? WBC 7.7 05/03/2020  ? HGB 14.7 05/03/2020  ? HCT 43.9 05/03/2020  ? MCV 91 05/03/2020  ? PLT 227 05/03/2020  ? ?Lab Results  ?Component Value Date  ? ALT 15 05/03/2020  ? AST 18 05/03/2020  ? ALKPHOS 90 05/03/2020  ? BILITOT 0.5 05/03/2020  ? ?Lab Results  ?Component Value Date  ? VD25OH 48.5 08/10/2015  ?  ? ?Review of Systems  ?Constitutional:  Negative for chills, fatigue and fever.  ?HENT:  Negative for congestion, hearing loss, tinnitus, trouble swallowing and voice change.   ?Eyes:  Negative for visual disturbance.  ?Respiratory:  Negative for cough, chest tightness, shortness of breath and wheezing.   ?Cardiovascular:  Negative for chest pain, palpitations and leg swelling.  ?Gastrointestinal:  Negative for abdominal pain, constipation, diarrhea and vomiting.  ?Endocrine: Negative for polydipsia and polyuria.  ?Genitourinary:  Negative for dysuria, frequency, genital sores, vaginal bleeding and vaginal discharge.  ?Musculoskeletal:  Negative for arthralgias, gait problem and joint swelling.  ?Skin:  Negative for color change and rash.  ?Neurological:  Negative for dizziness, tremors, light-headedness and headaches.  ?Hematological:  Negative for adenopathy. Does not bruise/bleed easily.  ?Psychiatric/Behavioral:  Positive for depression. Negative for dysphoric mood and sleep disturbance. The patient is nervous/anxious.   ? ?Patient Active Problem List  ? Diagnosis  Date Noted  ? Raynaud's disease without gangrene 04/01/2019  ? Vitamin B12 deficiency 07/01/2017  ? Migraine variants, not intractable 05/05/2017  ? Chronic pain of both knees 05/05/2017  ? Major depressive disorder with single episode, in partial remission ([place]) 01/01/2016  ? Abnormal mammogram 12/28/2015  ? BMI  45.0-49.9, adult ([place]) 12/28/2015  ? Environmental and seasonal allergies 08/10/2015  ? Hypothyroidism due to acquired atrophy of thyroid 08/10/2015  ? Vitamin D deficiency 08/10/2015  ? Generalized anxiety disorder 05/04/2015  ? ? ?Allergies  ?Allergen Reactions  ? Erythromycin   ? Azithromycin Nausea Only  ?  And dizziness  ? ? ?Past Surgical History:  ?Procedure Laterality Date  ? BILATERAL CARPAL TUNNEL RELEASE Bilateral 2010  ? LEEP  2002  ? For abnormal pap, followed by cryosurgery  ? ? ?Social History  ? ?Tobacco Use  ? Smoking status: Never  ? Smokeless tobacco: Never  ?Substance Use Topics  ? Alcohol use: Yes  ?  Alcohol/week: 1.0 standard drink  ?  Types: 1 Standard drinks or equivalent per week  ?  Comment: Rarely  ? Drug use: No  ? ? ? ?Medication list has been reviewed and updated. ? ?Current Meds  ?Medication Sig  ? Acetaminophen (TYLENOL PO) Take by mouth as needed.  ? b complex vitamins capsule Take 1 capsule by mouth daily.  ? Cetirizine HCl 10 MG CAPS   ? Cholecalciferol (DIALYVITE VITAMIN D 5000 PO) Take 1 tablet by mouth daily.  ? etonogestrel (NEXPLANON) 68 MG IMPL implant 1 each by Subdermal route once.  ? levothyroxine (SYNTHROID) 75 MCG tablet Take 1 tablet (75 mcg total) by mouth daily before breakfast.  ? topiramate (TOPAMAX) 50 MG tablet TAKE 1 TABLET BY MOUTH EVERY DAY  ? TRINTELLIX 10 MG TABS tablet TAKE 1 TABLET BY MOUTH EVERY DAY  ? ? ? ?  05/06/2021  ?  9:21 AM 10/23/2020  ?  8:57 AM 05/03/2020  ?  9:37 AM 01/12/2020  ? 10:36 AM  ?GAD 7 : Generalized Anxiety Score  ?Nervous, Anxious, on Edge 0 0 0 0  ?Control/stop worrying 2 0 0 0  ?Worry too much - different things 2 0 0 0  ?Trouble relaxing 1 0 0 0  ?Restless 0 0 0 0  ?Easily annoyed or irritable 2 0 0 0  ?Afraid - awful might happen 0 0 0 0  ?Total GAD 7 Score 7 0 0 0  ?Anxiety Difficulty Not difficult at all Not difficult at all    ? ? ? ?  05/06/2021  ?  9:20 AM  ?Depression screen PHQ 2/9  ?Decreased Interest 1  ?Down, Depressed,  Hopeless 1  ?PHQ - 2 Score 2  ?Altered sleeping 2  ?Tired, decreased energy 1  ?Change in appetite 1  ?Feeling bad or failure about yourself  0  ?Trouble concentrating 0  ?Moving slowly or fidgety/restless 0  ?Suicidal thoughts 0  ?PHQ-9 Score 6  ?Difficult doing work/chores Not difficult at all  ? ? ?BP Readings from Last 3 Encounters:  ?05/06/21 122/78  ?10/23/20 130/74  ?05/03/20 104/78  ? ? ?Physical Exam ?Vitals and nursing note reviewed.  ?Constitutional:   ?   General: She is not in acute distress. ?   Appearance: She is well-developed.  ?HENT:  ?   Head: Normocephalic and atraumatic.  ?   Right Ear: Tympanic membrane and ear canal normal.  ?   Left Ear: Tympanic membrane and ear canal normal.  ?  Nose:  ?   Right Sinus: No maxillary sinus tenderness.  ?   Left Sinus: No maxillary sinus tenderness.  ?Eyes:  ?   General: No scleral icterus.    ?   Right eye: No discharge.     ?   Left eye: No discharge.  ?   Conjunctiva/sclera: Conjunctivae normal.  ?Neck:  ?   Thyroid: No thyromegaly.  ?   Vascular: No carotid bruit.  ?Cardiovascular:  ?   Rate and Rhythm: Normal rate and regular rhythm.  ?   Pulses: Normal pulses.  ?   Heart sounds: Normal heart sounds.  ?Pulmonary:  ?   Effort: Pulmonary effort is normal. No respiratory distress.  ?   Breath sounds: No wheezing.  ?Chest:  ?Breasts: ?   Right: No mass, nipple discharge, skin change or tenderness.  ?   Left: No mass, nipple discharge, skin change or tenderness.  ?Abdominal:  ?   General: Bowel sounds are normal.  ?   Palpations: Abdomen is soft.  ?   Tenderness: There is no abdominal tenderness.  ?Musculoskeletal:  ?   Cervical back: Normal range of motion. No erythema.  ?   Right lower leg: No edema.  ?   Left lower leg: No edema.  ?Lymphadenopathy:  ?   Cervical: No cervical adenopathy.  ?Skin: ?   General: Skin is warm and dry.  ?   Capillary Refill: Capillary refill takes less than 2 seconds.  ?   Findings: No rash.  ?Neurological:  ?   Mental Status:  She is alert and oriented to person, place, and time.  ?   Cranial Nerves: No cranial nerve deficit.  ?   Sensory: No sensory deficit.  ?   Deep Tendon Reflexes: Reflexes are normal and symmetric.  ?Psychiatric:

## 2021-05-07 LAB — LIPID PANEL
Chol/HDL Ratio: 3.8 ratio (ref 0.0–4.4)
Cholesterol, Total: 192 mg/dL (ref 100–199)
HDL: 51 mg/dL (ref >39)
LDL Chol Calc (NIH): 126 mg/dL — ABNORMAL HIGH (ref 0–99)
Triglycerides: 80 mg/dL (ref 0–149)
VLDL Cholesterol Cal: 15 mg/dL (ref 5–40)

## 2021-05-07 LAB — CBC WITH DIFFERENTIAL/PLATELET
Basophils Absolute: 0.1 10*3/uL (ref 0.0–0.2)
Basos: 1 %
EOS (ABSOLUTE): 0.2 10*3/uL (ref 0.0–0.4)
Eos: 3 %
Hematocrit: 44.5 % (ref 34.0–46.6)
Hemoglobin: 14.8 g/dL (ref 11.1–15.9)
Immature Grans (Abs): 0 10*3/uL (ref 0.0–0.1)
Immature Granulocytes: 0 %
Lymphocytes Absolute: 2.3 10*3/uL (ref 0.7–3.1)
Lymphs: 35 %
MCH: 30.1 pg (ref 26.6–33.0)
MCHC: 33.3 g/dL (ref 31.5–35.7)
MCV: 90 fL (ref 79–97)
Monocytes Absolute: 0.3 10*3/uL (ref 0.1–0.9)
Monocytes: 5 %
Neutrophils Absolute: 3.7 10*3/uL (ref 1.4–7.0)
Neutrophils: 56 %
Platelets: 244 10*3/uL (ref 150–450)
RBC: 4.92 x10E6/uL (ref 3.77–5.28)
RDW: 12.1 % (ref 11.7–15.4)
WBC: 6.5 10*3/uL (ref 3.4–10.8)

## 2021-05-07 LAB — COMPREHENSIVE METABOLIC PANEL
ALT: 14 IU/L (ref 0–32)
AST: 13 IU/L (ref 0–40)
Albumin/Globulin Ratio: 1.5 (ref 1.2–2.2)
Albumin: 4.2 g/dL (ref 3.8–4.8)
Alkaline Phosphatase: 108 IU/L (ref 44–121)
BUN/Creatinine Ratio: 18 (ref 9–23)
BUN: 14 mg/dL (ref 6–24)
Bilirubin Total: 0.4 mg/dL (ref 0.0–1.2)
CO2: 23 mmol/L (ref 20–29)
Calcium: 10 mg/dL (ref 8.7–10.2)
Chloride: 103 mmol/L (ref 96–106)
Creatinine, Ser: 0.78 mg/dL (ref 0.57–1.00)
Globulin, Total: 2.8 g/dL (ref 1.5–4.5)
Glucose: 97 mg/dL (ref 70–99)
Potassium: 3.9 mmol/L (ref 3.5–5.2)
Sodium: 137 mmol/L (ref 134–144)
Total Protein: 7 g/dL (ref 6.0–8.5)
eGFR: 95 mL/min/{1.73_m2} (ref >59)

## 2021-05-07 LAB — TSH+FREE T4
Free T4: 1.39 ng/dL (ref 0.82–1.77)
TSH: 0.648 u[IU]/mL (ref 0.450–4.500)

## 2021-05-07 LAB — HEMOGLOBIN A1C
Est. average glucose Bld gHb Est-mCnc: 131 mg/dL
Hgb A1c MFr Bld: 6.2 % — ABNORMAL HIGH (ref 4.8–5.6)

## 2021-05-13 DIAGNOSIS — Z1211 Encounter for screening for malignant neoplasm of colon: Secondary | ICD-10-CM | POA: Diagnosis not present

## 2021-05-16 ENCOUNTER — Encounter: Payer: Self-pay | Admitting: Internal Medicine

## 2021-05-16 ENCOUNTER — Telehealth: Payer: Self-pay | Admitting: Internal Medicine

## 2021-05-16 ENCOUNTER — Other Ambulatory Visit: Payer: Self-pay | Admitting: Internal Medicine

## 2021-05-16 DIAGNOSIS — F324 Major depressive disorder, single episode, in partial remission: Secondary | ICD-10-CM

## 2021-05-16 MED ORDER — VORTIOXETINE HBR 20 MG PO TABS: 20.0000 mg | ORAL_TABLET | Freq: Every day | ORAL | 1 refills | Status: DC

## 2021-05-16 NOTE — Telephone Encounter (Signed)
Copied from [place] [id number]. Topic: General - Call Back - No Documentation ?>> May 16, 2021 11:58 AM Erick Blinks wrote: ?Reason for CRM: Pt called and is requesting to speak to [facility] regarding her medication. Tried calling office  ?Best contact: [phone] ?

## 2021-05-16 NOTE — Telephone Encounter (Signed)
Refill sent in for 20 MG. ? ?KP ?

## 2021-05-21 LAB — COLOGUARD: COLOGUARD: NEGATIVE

## 2021-07-02 DIAGNOSIS — R7303 Prediabetes: Secondary | ICD-10-CM | POA: Insufficient documentation

## 2021-07-11 ENCOUNTER — Other Ambulatory Visit: Payer: Self-pay | Admitting: Internal Medicine

## 2021-07-11 ENCOUNTER — Encounter: Payer: Self-pay | Admitting: Internal Medicine

## 2021-07-11 DIAGNOSIS — G43809 Other migraine, not intractable, without status migrainosus: Secondary | ICD-10-CM

## 2021-07-11 DIAGNOSIS — F324 Major depressive disorder, single episode, in partial remission: Secondary | ICD-10-CM

## 2021-07-11 NOTE — Telephone Encounter (Signed)
Requested Prescriptions  Pending Prescriptions Disp Refills  . topiramate (TOPAMAX) 50 MG tablet [Pharmacy Med Name: TOPIRAMATE 50 MG TABLET] 90 tablet 0    Sig: TAKE 1 TABLET BY MOUTH EVERY DAY     Neurology: Anticonvulsants - topiramate & zonisamide Passed - 07/11/2021  3:21 AM      Passed - Cr in normal range and within 360 days    Creatinine, Ser  Date Value Ref Range Status  05/06/2021 0.78 0.57 - 1.00 mg/dL Final         Passed - CO2 in normal range and within 360 days    CO2  Date Value Ref Range Status  05/06/2021 23 20 - 29 mmol/L Final         Passed - ALT in normal range and within 360 days    ALT  Date Value Ref Range Status  05/06/2021 14 0 - 32 IU/L Final         Passed - AST in normal range and within 360 days    AST  Date Value Ref Range Status  05/06/2021 13 0 - 40 IU/L Final         Passed - Completed PHQ-2 or PHQ-9 in the last 360 days      Passed - Valid encounter within last 12 months    Recent Outpatient Visits          2 months ago Annual physical exam   [facility] Glean Hess, MD   8 months ago Hypothyroidism due to acquired atrophy of thyroid   [facility]an [facility], [facility]   1 year ago Annual physical exam   [facility] Glean Hess, MD   1 year ago Migraine variants, not intractable   [facility] Glean Hess, MD   2 years ago Annual physical exam   [facility] Glean Hess, MD      Future Appointments            In 10 months Glean Hess, MD [facility], [place]           . TRINTELLIX 10 MG TABS tablet [Pharmacy Med Name: [place] 10 MG TABLET] 90 tablet 0    Sig: TAKE 1 TABLET BY MOUTH EVERY DAY     Psychiatry: Antidepressants - Serotonin Modulator Passed - 07/11/2021  3:21 AM      Passed - Completed PHQ-2 or PHQ-9 in the last 360 days      Passed - Valid encounter within last 6 months    Recent Outpatient Visits          2 months ago  Annual physical exam   [facility] Glean Hess, MD   8 months ago Hypothyroidism due to acquired atrophy of thyroid   [facility] Glean Hess, MD   1 year ago Annual physical exam   [facility] Glean Hess, MD   1 year ago Migraine variants, not intractable   [facility] Glean Hess, MD   2 years ago Annual physical exam   [facility] Glean Hess, MD      Future Appointments            In 10 months Army Melia Jesse Sans, MD [facility], [facility]

## 2021-10-18 ENCOUNTER — Other Ambulatory Visit: Payer: Self-pay | Admitting: Internal Medicine

## 2021-10-18 DIAGNOSIS — G43809 Other migraine, not intractable, without status migrainosus: Secondary | ICD-10-CM

## 2021-10-18 NOTE — Telephone Encounter (Signed)
Requested Prescriptions  Pending Prescriptions Disp Refills  . topiramate (TOPAMAX) 50 MG tablet [Pharmacy Med Name: TOPIRAMATE 50 MG TABLET] 90 tablet 2    Sig: TAKE 1 TABLET BY MOUTH EVERY DAY     Neurology: Anticonvulsants - topiramate & zonisamide Passed - 10/18/2021  2:24 AM      Passed - Cr in normal range and within 360 days    Creatinine, Ser  Date Value Ref Range Status  05/06/2021 0.78 0.57 - 1.00 mg/dL Final         Passed - CO2 in normal range and within 360 days    CO2  Date Value Ref Range Status  05/06/2021 23 20 - 29 mmol/L Final         Passed - ALT in normal range and within 360 days    ALT  Date Value Ref Range Status  05/06/2021 14 0 - 32 IU/L Final         Passed - AST in normal range and within 360 days    AST  Date Value Ref Range Status  05/06/2021 13 0 - 40 IU/L Final         Passed - Completed PHQ-2 or PHQ-9 in the last 360 days      Passed - Valid encounter within last 12 months    Recent Outpatient Visits          5 months ago Annual physical exam   [place] [facility], Nyoka Cowden, MD   12 months ago Hypothyroidism due to acquired atrophy of thyroid   [place] [facility] at [facility], Nyoka Cowden, MD   1 year ago Annual physical exam   [place] [facility] at [facility], Nyoka Cowden, MD   1 year ago Migraine variants, not intractable   [place] [facility] at [facility], Nyoka Cowden, MD   2 years ago Annual physical exam   [facility] at [facility], Nyoka Cowden, MD      Future Appointments            In 6 months Judithann Graves, Nyoka Cowden, MD [facility] at [facility], [facility]

## 2021-11-10 ENCOUNTER — Other Ambulatory Visit: Payer: Self-pay | Admitting: Internal Medicine

## 2021-11-10 DIAGNOSIS — F324 Major depressive disorder, single episode, in partial remission: Secondary | ICD-10-CM

## 2021-11-10 DIAGNOSIS — E034 Atrophy of thyroid (acquired): Secondary | ICD-10-CM

## 2021-11-11 NOTE — Telephone Encounter (Signed)
Requested Prescriptions  Pending Prescriptions Disp Refills  . levothyroxine (SYNTHROID) 75 MCG tablet [Pharmacy Med Name: LEVOTHYROXINE 75 MCG TABLET] 90 tablet 1    Sig: TAKE 1 TABLET BY MOUTH DAILY BEFORE BREAKFAST.     Endocrinology:  Hypothyroid Agents Passed - 11/10/2021  2:46 PM      Passed - TSH in normal range and within 360 days    TSH  Date Value Ref Range Status  05/06/2021 0.648 0.450 - 4.500 uIU/mL Final         Passed - Valid encounter within last 12 months    Recent Outpatient Visits          6 months ago Annual physical exam   [place] [facility] at [facility], Jesse Sans, MD   1 year ago Hypothyroidism due to acquired atrophy of thyroid   [place] [facility] at [facility], Jesse Sans, MD   1 year ago Annual physical exam   [place] [facility] at [facility], Jesse Sans, MD   1 year ago Migraine variants, not intractable   [place] [facility] at [facility], Jesse Sans, MD   2 years ago Annual physical exam   [facility] at [facility], Jesse Sans, MD      Future Appointments            In 5 months Army Melia, Jesse Sans, MD [facility] at [facility], [facility]           . TRINTELLIX 20 MG TABS tablet [Pharmacy Med Name: TRINTELLIX 20 MG TABLET] 90 tablet 1    Sig: TAKE 1 TABLET BY MOUTH EVERY DAY     Psychiatry: Antidepressants - Serotonin Modulator Failed - 11/10/2021  2:46 PM      Failed - Valid encounter within last 6 months    Recent Outpatient Visits          6 months ago Annual physical exam   [place] [facility] at [facility], Jesse Sans, MD   1 year ago Hypothyroidism due to acquired atrophy of thyroid   [place] [facility] at [facility], Jesse Sans, MD   1  year ago Annual physical exam   [place] [facility] at [facility], Jesse Sans, MD   1 year ago Migraine variants, not intractable    Primary Care and Sports Medicine at [facility], Jesse Sans, MD   2 years ago Annual physical exam   [facility] at [facility], Jesse Sans, MD      Future Appointments            In 5 months Army Melia Jesse Sans, MD [facility] Medicine at [facility], [place] - Completed PHQ-2 or PHQ-9 in the last 360 days

## 2021-12-08 ENCOUNTER — Encounter: Payer: Self-pay | Admitting: Internal Medicine

## 2022-01-30 DIAGNOSIS — J101 Influenza due to other identified influenza virus with other respiratory manifestations: Secondary | ICD-10-CM | POA: Diagnosis not present

## 2022-05-07 ENCOUNTER — Other Ambulatory Visit: Payer: Self-pay | Admitting: Internal Medicine

## 2022-05-07 DIAGNOSIS — E034 Atrophy of thyroid (acquired): Secondary | ICD-10-CM

## 2022-05-07 DIAGNOSIS — F324 Major depressive disorder, single episode, in partial remission: Secondary | ICD-10-CM

## 2022-05-08 ENCOUNTER — Ambulatory Visit (INDEPENDENT_AMBULATORY_CARE_PROVIDER_SITE_OTHER): Payer: BC Managed Care – PPO | Admitting: Internal Medicine

## 2022-05-08 ENCOUNTER — Encounter: Payer: Self-pay | Admitting: Internal Medicine

## 2022-05-08 VITALS — BP 122/76 | HR 76 | Ht 64.0 in | Wt 284.0 lb

## 2022-05-08 DIAGNOSIS — Z1231 Encounter for screening mammogram for malignant neoplasm of breast: Secondary | ICD-10-CM | POA: Diagnosis not present

## 2022-05-08 DIAGNOSIS — E538 Deficiency of other specified B group vitamins: Secondary | ICD-10-CM

## 2022-05-08 DIAGNOSIS — Z6841 Body Mass Index (BMI) 40.0 and over, adult: Secondary | ICD-10-CM

## 2022-05-08 DIAGNOSIS — Z0000 Encounter for general adult medical examination without abnormal findings: Secondary | ICD-10-CM

## 2022-05-08 DIAGNOSIS — F324 Major depressive disorder, single episode, in partial remission: Secondary | ICD-10-CM

## 2022-05-08 DIAGNOSIS — E034 Atrophy of thyroid (acquired): Secondary | ICD-10-CM | POA: Diagnosis not present

## 2022-05-08 DIAGNOSIS — G43809 Other migraine, not intractable, without status migrainosus: Secondary | ICD-10-CM

## 2022-05-08 DIAGNOSIS — R7303 Prediabetes: Secondary | ICD-10-CM | POA: Diagnosis not present

## 2022-05-08 MED ORDER — LEVOTHYROXINE SODIUM 75 MCG PO TABS: 75.0000 ug | ORAL_TABLET | Freq: Every day | ORAL | 1 refills | Status: DC

## 2022-05-08 MED ORDER — VORTIOXETINE HBR 20 MG PO TABS: 20.0000 mg | ORAL_TABLET | Freq: Every day | ORAL | 1 refills | Status: DC

## 2022-05-08 NOTE — Progress Notes (Signed)
Date:  05/08/2022   Name:  Victoria Dixon   DOB:  01/03/1976   MRN:  [id number]   Chief Complaint: Annual Exam [name] is a 47 y.o. female who presents today for her Complete Annual Exam. She feels well. She reports exercising/ walking. She reports she is sleeping well. Breast complaints - none.  She is on [employer] and has lost 17 lbs.  Mammogram: 09/2020 DEXA: none Pap smear: 03/2019 neg/neg Colonoscopy: 05/2021 Cologuard negative  Health Maintenance Due  Topic Date Due   HIV Screening  Never done   COVID-19 Vaccine (4 - 2023-24 season) 02/01/2022    Immunization History  Administered Date(s) Administered   Influenza,inj,Quad PF,6+ Mos 01/12/2020, 11/14/2020   Influenza-Unspecified 11/22/2017, 11/06/2018, 11/14/2020, 12/07/2021   Moderna Sars-Covid-2 Vaccination 09/11/2019, 10/09/2019   Tdap 04/25/2016, 05/30/2017   Unspecified SARS-COV-2 Vaccination 12/07/2021    Thyroid Problem Presents for follow-up visit. Patient reports no anxiety, constipation, diarrhea, fatigue, palpitations or tremors. The symptoms have been stable.  Depression        This is a chronic problem.The problem is unchanged.  Associated symptoms include no fatigue and no headaches.  Past treatments include SNRIs - Serotonin and norepinephrine reuptake inhibitors.  Compliance with treatment is good.  Past medical history includes thyroid problem.   Migraine  This is a recurrent problem. The problem has been unchanged. Pertinent negatives include no abdominal pain, coughing, dizziness, fever, hearing loss, tinnitus or vomiting. Treatments tried: topamax.    Lab Results  Component Value Date   NA 137 05/06/2021   K 3.9 05/06/2021   CO2 23 05/06/2021   GLUCOSE 97 05/06/2021   BUN 14 05/06/2021   CREATININE 0.78 05/06/2021   CALCIUM 10.0 05/06/2021   EGFR 95 05/06/2021   GFRNONAA 106 04/01/2019   Lab Results  Component Value Date   CHOL 192 05/06/2021   HDL 51 05/06/2021   LDLCALC 126 (H)  05/06/2021   TRIG 80 05/06/2021   CHOLHDL 3.8 05/06/2021   Lab Results  Component Value Date   TSH 0.648 05/06/2021   Lab Results  Component Value Date   HGBA1C 6.2 (H) 05/06/2021   Lab Results  Component Value Date   WBC 6.5 05/06/2021   HGB 14.8 05/06/2021   HCT 44.5 05/06/2021   MCV 90 05/06/2021   PLT 244 05/06/2021   Lab Results  Component Value Date   ALT 14 05/06/2021   AST 13 05/06/2021   ALKPHOS 108 05/06/2021   BILITOT 0.4 05/06/2021   Lab Results  Component Value Date   VD25OH 48.5 08/10/2015     Review of Systems  Constitutional:  Negative for chills, fatigue and fever.  HENT:  Negative for congestion, hearing loss, tinnitus, trouble swallowing and voice change.   Eyes:  Negative for visual disturbance.  Respiratory:  Negative for cough, chest tightness, shortness of breath and wheezing.   Cardiovascular:  Negative for chest pain, palpitations and leg swelling.  Gastrointestinal:  Negative for abdominal pain, constipation, diarrhea and vomiting.  Endocrine: Negative for polydipsia and polyuria.  Genitourinary:  Negative for dysuria, frequency, genital sores, vaginal bleeding and vaginal discharge.  Musculoskeletal:  Negative for arthralgias, gait problem and joint swelling.  Skin:  Negative for color change and rash.  Neurological:  Negative for dizziness, tremors, light-headedness and headaches.  Hematological:  Negative for adenopathy. Does not bruise/bleed easily.  Psychiatric/Behavioral:  Positive for depression. Negative for dysphoric mood and sleep disturbance. The patient is not nervous/anxious.  Patient Active Problem List   Diagnosis Date Noted   Prediabetes 07/02/2021   Raynaud's disease without gangrene 04/01/2019   Vitamin B12 deficiency 07/01/2017   Migraine variants, not intractable 05/05/2017   Chronic pain of both knees 05/05/2017   Major depressive disorder with single episode, in partial remission 01/01/2016   Abnormal mammogram  12/28/2015   BMI 45.0-49.9, adult 12/28/2015   Environmental and seasonal allergies 08/10/2015   Hypothyroidism due to acquired atrophy of thyroid 08/10/2015   Vitamin D deficiency 08/10/2015   Generalized anxiety disorder 05/04/2015    Allergies  Allergen Reactions   Erythromycin    Azithromycin Nausea Only    And dizziness    Past Surgical History:  Procedure Laterality Date   BILATERAL CARPAL TUNNEL RELEASE Bilateral 2010   LEEP  2002   For abnormal pap, followed by cryosurgery    Social History   Tobacco Use   Smoking status: Never   Smokeless tobacco: Never  Substance Use Topics   Alcohol use: Yes    Alcohol/week: 1.0 standard drink of alcohol    Types: 1 Standard drinks or equivalent per week    Comment: Rarely   Drug use: No     Medication list has been reviewed and updated.  Current Meds  Medication Sig   Acetaminophen (TYLENOL PO) Take by mouth as needed.   b complex vitamins capsule Take 1 capsule by mouth daily.   Cetirizine HCl 10 MG CAPS    Cholecalciferol (DIALYVITE VITAMIN D 5000 PO) Take 1 tablet by mouth daily.   cyanocobalamin (VITAMIN B12) 500 MCG tablet Take 500 mcg by mouth daily.   etonogestrel (NEXPLANON) 68 MG IMPL implant 1 each by Subdermal route once.   topiramate (TOPAMAX) 50 MG tablet TAKE 1 TABLET BY MOUTH EVERY DAY   [DISCONTINUED] levothyroxine (SYNTHROID) 75 MCG tablet TAKE 1 TABLET BY MOUTH DAILY BEFORE BREAKFAST.   [DISCONTINUED] TRINTELLIX 20 MG TABS tablet TAKE 1 TABLET BY MOUTH EVERY DAY       05/08/2022    9:15 AM 05/06/2021    9:21 AM 10/23/2020    8:57 AM 05/03/2020    9:37 AM  GAD 7 : Generalized Anxiety Score  Nervous, Anxious, on Edge 1 0 0 0  Control/stop worrying 0 2 0 0  Worry too much - different things 1 2 0 0  Trouble relaxing 0 1 0 0  Restless 0 0 0 0  Easily annoyed or irritable 1 2 0 0  Afraid - awful might happen 1 0 0 0  Total GAD 7 Score 4 7 0 0  Anxiety Difficulty Not difficult at all Not difficult at  all Not difficult at all        05/08/2022    9:15 AM 05/06/2021    9:20 AM 10/23/2020    8:57 AM  Depression screen PHQ 2/9  Decreased Interest 0 1 0  Down, Depressed, Hopeless 0 1 0  PHQ - 2 Score 0 2 0  Altered sleeping 1 2 1   Tired, decreased energy 1 1 1   Change in appetite 1 1 1   Feeling bad or failure about yourself  0 0 0  Trouble concentrating 0 0 0  Moving slowly or fidgety/restless 0 0 0  Suicidal thoughts 0 0 0  PHQ-9 Score 3 6 3   Difficult doing work/chores Not difficult at all Not difficult at all Not difficult at all    BP Readings from Last 3 Encounters:  05/08/22 122/76  05/06/21 122/78  10/23/20  130/74    Physical Exam Vitals and nursing note reviewed.  Constitutional:      General: She is not in acute distress.    Appearance: She is well-developed.  HENT:     Head: Normocephalic and atraumatic.     Right Ear: Tympanic membrane and ear canal normal.     Left Ear: Tympanic membrane and ear canal normal.     Nose:     Right Sinus: No maxillary sinus tenderness.     Left Sinus: No maxillary sinus tenderness.  Eyes:     General: No scleral icterus.       Right eye: No discharge.        Left eye: No discharge.     Conjunctiva/sclera: Conjunctivae normal.  Neck:     Thyroid: No thyromegaly.     Vascular: No carotid bruit.  Cardiovascular:     Rate and Rhythm: Normal rate and regular rhythm.     Pulses: Normal pulses.     Heart sounds: Normal heart sounds.  Pulmonary:     Effort: Pulmonary effort is normal. No respiratory distress.     Breath sounds: No wheezing.  Chest:  Breasts:    Right: No mass, nipple discharge, skin change or tenderness.     Left: No mass, nipple discharge, skin change or tenderness.  Abdominal:     General: Bowel sounds are normal.     Palpations: Abdomen is soft.     Tenderness: There is no abdominal tenderness.  Musculoskeletal:     Cervical back: Normal range of motion. No erythema.     Right lower leg: No edema.      Left lower leg: No edema.  Lymphadenopathy:     Cervical: No cervical adenopathy.  Skin:    General: Skin is warm and dry.     Findings: No rash.  Neurological:     Mental Status: She is alert and oriented to person, place, and time.     Cranial Nerves: No cranial nerve deficit.     Sensory: No sensory deficit.     Deep Tendon Reflexes: Reflexes are normal and symmetric.  Psychiatric:        Attention and Perception: Attention normal.        Mood and Affect: Mood normal.     Wt Readings from Last 3 Encounters:  05/08/22 284 lb (128.8 kg)  05/06/21 285 lb (129.3 kg)  10/23/20 276 lb (125.2 kg)    BP 122/76   Pulse 76   Ht 5\' 4"  (1.626 m)   Wt 284 lb (128.8 kg)   SpO2 95%   BMI 48.75 kg/m   Assessment and Plan:  Problem List Items Addressed This Visit       Cardiovascular and Mediastinum   Migraine variants, not intractable    No recent change in the character or frequency of headaches. Headaches respond well to current therapy with topamax. Will continue current plan and follow up if worsening.       Relevant Medications   vortioxetine HBr (TRINTELLIX) 20 MG TABS tablet   Other Relevant Orders   CBC with Differential/Platelet     Endocrine   Hypothyroidism due to acquired atrophy of thyroid    supplemented      Relevant Medications   levothyroxine (SYNTHROID) 75 MCG tablet   Other Relevant Orders   TSH + free T4     Other   Major depressive disorder with single episode, in partial remission    Clinically stable on  current regimen with good control of symptoms, No SI or HI. No change in management at this time. Trintellix since 2019      Relevant Medications   vortioxetine HBr (TRINTELLIX) 20 MG TABS tablet   Other Relevant Orders   Comprehensive metabolic panel   Prediabetes    Clinically stable  Managed with diet alone Lab Results  Component Value Date   HGBA1C 6.2 (H) 05/06/2021        Relevant Orders   Hemoglobin A1c   Vitamin B12  deficiency    Low level in 2019 - no recheck since Continue daily supplement      Relevant Orders   CBC with Differential/Platelet   Vitamin B12   Other Visit Diagnoses     Annual physical exam    -  Primary   Normal exam except for weight - she is working on weight loss with [employer] continue exercise up to date on immunizations   Relevant Orders   CBC with Differential/Platelet   Comprehensive metabolic panel   Hemoglobin A1c   Lipid panel   TSH + free T4   Vitamin B12   Encounter for screening mammogram for breast cancer       schedule at [facility]       Return in about 1 year (around 05/08/2023) for CPX.   Partially dictated using [place], any errors are not intentional.  Glean Hess, MD [place], [place]

## 2022-05-08 NOTE — Assessment & Plan Note (Addendum)
Low level in 2019 - no recheck since Continue daily supplement

## 2022-05-08 NOTE — Assessment & Plan Note (Signed)
Clinically stable  Managed with diet alone Lab Results  Component Value Date   HGBA1C 6.2 (H) 05/06/2021

## 2022-05-08 NOTE — Assessment & Plan Note (Signed)
supplemented

## 2022-05-08 NOTE — Patient Instructions (Addendum)
Schedule your mammogram at [facility].  [phone]

## 2022-05-08 NOTE — Assessment & Plan Note (Signed)
No recent change in the character or frequency of headaches. Headaches respond well to current therapy with topamax. Will continue current plan and follow up if worsening.

## 2022-05-08 NOTE — Assessment & Plan Note (Signed)
Clinically stable on current regimen with good control of symptoms, No SI or HI. No change in management at this time. Trintellix since 2019

## 2022-05-09 ENCOUNTER — Encounter: Payer: Self-pay | Admitting: Internal Medicine

## 2022-05-09 LAB — COMPREHENSIVE METABOLIC PANEL
ALT: 16 IU/L (ref 0–32)
AST: 16 IU/L (ref 0–40)
Albumin/Globulin Ratio: 1.7 (ref 1.2–2.2)
Albumin: 4.3 g/dL (ref 3.9–4.9)
Alkaline Phosphatase: 101 IU/L (ref 44–121)
BUN/Creatinine Ratio: 16 (ref 9–23)
BUN: 13 mg/dL (ref 6–24)
Bilirubin Total: 0.5 mg/dL (ref 0.0–1.2)
CO2: 21 mmol/L (ref 20–29)
Calcium: 9.6 mg/dL (ref 8.7–10.2)
Chloride: 106 mmol/L (ref 96–106)
Creatinine, Ser: 0.81 mg/dL (ref 0.57–1.00)
Globulin, Total: 2.6 g/dL (ref 1.5–4.5)
Glucose: 101 mg/dL — ABNORMAL HIGH (ref 70–99)
Potassium: 4.2 mmol/L (ref 3.5–5.2)
Sodium: 140 mmol/L (ref 134–144)
Total Protein: 6.9 g/dL (ref 6.0–8.5)
eGFR: 91 mL/min/{1.73_m2} (ref >59)

## 2022-05-09 LAB — CBC WITH DIFFERENTIAL/PLATELET
Basophils Absolute: 0.1 10*3/uL (ref 0.0–0.2)
Basos: 1 %
EOS (ABSOLUTE): 0.2 10*3/uL (ref 0.0–0.4)
Eos: 3 %
Hematocrit: 47 % — ABNORMAL HIGH (ref 34.0–46.6)
Hemoglobin: 15.5 g/dL (ref 11.1–15.9)
Immature Grans (Abs): 0 10*3/uL (ref 0.0–0.1)
Immature Granulocytes: 0 %
Lymphocytes Absolute: 1.9 10*3/uL (ref 0.7–3.1)
Lymphs: 31 %
MCH: 30.2 pg (ref 26.6–33.0)
MCHC: 33 g/dL (ref 31.5–35.7)
MCV: 91 fL (ref 79–97)
Monocytes Absolute: 0.3 10*3/uL (ref 0.1–0.9)
Monocytes: 6 %
Neutrophils Absolute: 3.6 10*3/uL (ref 1.4–7.0)
Neutrophils: 59 %
Platelets: 241 10*3/uL (ref 150–450)
RBC: 5.14 x10E6/uL (ref 3.77–5.28)
RDW: 12.1 % (ref 11.7–15.4)
WBC: 6 10*3/uL (ref 3.4–10.8)

## 2022-05-09 LAB — TSH+FREE T4
Free T4: 1.4 ng/dL (ref 0.82–1.77)
TSH: 0.532 u[IU]/mL (ref 0.450–4.500)

## 2022-05-09 LAB — LIPID PANEL
Chol/HDL Ratio: 4 ratio (ref 0.0–4.4)
Cholesterol, Total: 183 mg/dL (ref 100–199)
HDL: 46 mg/dL (ref >39)
LDL Chol Calc (NIH): 123 mg/dL — ABNORMAL HIGH (ref 0–99)
Triglycerides: 77 mg/dL (ref 0–149)
VLDL Cholesterol Cal: 14 mg/dL (ref 5–40)

## 2022-05-09 LAB — HEMOGLOBIN A1C
Est. average glucose Bld gHb Est-mCnc: 126 mg/dL
Hgb A1c MFr Bld: 6 % — ABNORMAL HIGH (ref 4.8–5.6)

## 2022-05-09 LAB — VITAMIN B12: Vitamin B-12: >2000 pg/mL — ABNORMAL HIGH (ref 232–1245)

## 2022-07-10 ENCOUNTER — Other Ambulatory Visit: Payer: Self-pay | Admitting: Internal Medicine

## 2022-07-10 DIAGNOSIS — G43809 Other migraine, not intractable, without status migrainosus: Secondary | ICD-10-CM

## 2022-07-10 NOTE — Telephone Encounter (Signed)
Notified patient of message.

## 2022-07-10 NOTE — Telephone Encounter (Signed)
Please call pt and remind her to schedule her mammogram.  at [phone].

## 2022-08-25 ENCOUNTER — Encounter: Payer: Self-pay | Admitting: Internal Medicine

## 2022-08-25 NOTE — Telephone Encounter (Signed)
FYI  KP

## 2022-09-09 ENCOUNTER — Telehealth: Payer: Self-pay | Admitting: Internal Medicine

## 2022-09-09 NOTE — Telephone Encounter (Signed)
Called and left patient VM to call back.   - Victoria Dixon

## 2022-09-09 NOTE — Telephone Encounter (Signed)
Pt is calling to follow up Trintellix is no longer covered.  The manufacturer is going to try and to assist the patient with the the medication for free through patient assistance program.  Paper work was received 09/01/22 Please advise with the patient CB- [phone]

## 2022-09-12 NOTE — Telephone Encounter (Signed)
Victoria Dixon with [employer] has called in regards to missing information on the [place] form. Per Sedalia Muta, she stated missing the doctor's application page that was faxed over around 08/25/2022, for Trintellix medication for patient. They just need that 1 page faxed back to [phone], Victoria Dixon stated it would be the 4th page. Please advise.

## 2022-09-12 NOTE — Telephone Encounter (Signed)
Documents still are not scanned into patients chart so have nothing to go by. Asked patient to contact manufacturer to have them refax [place] these patient assistance forms to complete and refax.  - Victoria Dixon

## 2022-09-17 ENCOUNTER — Telehealth: Payer: Self-pay | Admitting: Internal Medicine

## 2022-09-17 NOTE — Telephone Encounter (Signed)
Lafonda Mosses wants me to fax the form to [phone]. Faxing now.  - Everlina Gotts

## 2022-09-17 NOTE — Telephone Encounter (Signed)
Copied from CRM [id number]. Topic: General - Inquiry >> Sep 17, 2022  2:57 PM Marlow Baars wrote: Reason for CRM: Lafonda Mosses with [employer] called in regarding previous fax that was sent to the provider to sign. It was regarding [employer] Patient Assistance for the patient to obtain vortioxetine HBr (TRINTELLIX) 20 MG TABS tablet. She is looking to speak with Chassidy so please call [phone]

## 2022-09-19 ENCOUNTER — Encounter: Payer: Self-pay | Admitting: Internal Medicine

## 2022-11-05 ENCOUNTER — Other Ambulatory Visit: Payer: Self-pay | Admitting: Internal Medicine

## 2022-11-05 DIAGNOSIS — E034 Atrophy of thyroid (acquired): Secondary | ICD-10-CM

## 2023-02-18 ENCOUNTER — Encounter: Payer: Self-pay | Admitting: Internal Medicine

## 2023-04-22 ENCOUNTER — Other Ambulatory Visit: Payer: Self-pay

## 2023-04-22 DIAGNOSIS — G43809 Other migraine, not intractable, without status migrainosus: Secondary | ICD-10-CM

## 2023-04-22 MED ORDER — TOPIRAMATE 50 MG PO TABS
50.0000 mg | ORAL_TABLET | Freq: Every day | ORAL | 0 refills | Status: DC
Start: 2023-04-22 — End: 2023-07-23

## 2023-05-12 ENCOUNTER — Encounter: Payer: Self-pay | Admitting: Internal Medicine

## 2023-05-12 ENCOUNTER — Ambulatory Visit (INDEPENDENT_AMBULATORY_CARE_PROVIDER_SITE_OTHER): Payer: PRIVATE HEALTH INSURANCE | Admitting: Internal Medicine

## 2023-05-12 VITALS — BP 116/74 | HR 77 | Ht 64.0 in | Wt 259.5 lb

## 2023-05-12 DIAGNOSIS — F324 Major depressive disorder, single episode, in partial remission: Secondary | ICD-10-CM

## 2023-05-12 DIAGNOSIS — G43809 Other migraine, not intractable, without status migrainosus: Secondary | ICD-10-CM

## 2023-05-12 DIAGNOSIS — E785 Hyperlipidemia, unspecified: Secondary | ICD-10-CM

## 2023-05-12 DIAGNOSIS — Z1231 Encounter for screening mammogram for malignant neoplasm of breast: Secondary | ICD-10-CM

## 2023-05-12 DIAGNOSIS — E034 Atrophy of thyroid (acquired): Secondary | ICD-10-CM | POA: Diagnosis not present

## 2023-05-12 DIAGNOSIS — E538 Deficiency of other specified B group vitamins: Secondary | ICD-10-CM | POA: Diagnosis not present

## 2023-05-12 DIAGNOSIS — E559 Vitamin D deficiency, unspecified: Secondary | ICD-10-CM | POA: Diagnosis not present

## 2023-05-12 DIAGNOSIS — Z0000 Encounter for general adult medical examination without abnormal findings: Secondary | ICD-10-CM

## 2023-05-12 DIAGNOSIS — G4730 Sleep apnea, unspecified: Secondary | ICD-10-CM | POA: Insufficient documentation

## 2023-05-12 DIAGNOSIS — R7303 Prediabetes: Secondary | ICD-10-CM

## 2023-05-12 NOTE — Assessment & Plan Note (Signed)
 Supplemented.  Lab Results  Component Value Date   TSH 0.532 05/08/2022

## 2023-05-12 NOTE — Assessment & Plan Note (Signed)
 Continue daily supplement Lab Results  Component Value Date   VITAMINB12 >2000 (H) 05/08/2022

## 2023-05-12 NOTE — Assessment & Plan Note (Signed)
 Doing well on Topamax - followed by Neurology.

## 2023-05-12 NOTE — Assessment & Plan Note (Signed)
 On oral supplements daily. Lab Results  Component Value Date   VITAMINB12 >2000 (H) 05/08/2022

## 2023-05-12 NOTE — Assessment & Plan Note (Signed)
 Clinically stable on Trintellix.   No SI or HI on evaluation. Plan to continue same medications for now.

## 2023-05-12 NOTE — Patient Instructions (Addendum)
 Call [facility] to schedule your mammogram at [phone].  For itchy ear canals - place a small amount of hydrocortisone cream with a q-tip daily as needed.

## 2023-05-12 NOTE — Progress Notes (Signed)
 Date:  05/12/2023   Name:  Victoria Dixon   DOB:  [birth year]   MRN:  [id number]   Chief Complaint: Annual Exam Victoria Dixon is a 48 y.o. female who presents today for her Complete Annual Exam. She feels well. She reports exercising walks 15 minutes to 1 hours her dog and dogs at the animal shelter. She reports she is sleeping poorly. Breast complaints none.  Health Maintenance  Topic Date Due   HIV Screening  Never done   COVID-19 Vaccine (4 - 2024-25 season) 05/28/2023*   Flu Shot  09/04/2023   Pap with HPV screening  03/31/2024   Cologuard (Stool DNA test)  05/13/2024   DTaP/Tdap/Td vaccine (3 - Td or Tdap) 05/31/2027   Hepatitis C Screening  Completed   HPV Vaccine  Aged Out  *Topic was postponed. The date shown is not the original due date.    Thyroid Problem Presents for follow-up visit. Symptoms include fatigue (poor sleep with daytime sleepiness). Patient reports no anxiety, constipation, diarrhea or palpitations. The symptoms have been stable.  Depression        This is a chronic problem.The problem is unchanged.  Associated symptoms include fatigue (poor sleep with daytime sleepiness).  Associated symptoms include no myalgias and no headaches.  Past treatments include SNRIs - Serotonin and norepinephrine reuptake inhibitors.  Past medical history includes thyroid problem.   Fatigue - very sleeping during the day despite sufficient sleep at night.  Had sleep study in 2019 that was borderline - much more severe on her back but CPAP was not recommended.  Her symptoms are much worse recently and she wondered if this was perimenopause.  Review of Systems  Constitutional:  Positive for fatigue (poor sleep with daytime sleepiness). Negative for unexpected weight change.  HENT:  Negative for trouble swallowing.        Itching in ear  Eyes:  Negative for visual disturbance.  Respiratory:  Negative for cough, chest tightness, shortness of breath and wheezing.    Cardiovascular:  Negative for chest pain, palpitations and leg swelling.  Gastrointestinal:  Negative for abdominal pain, constipation and diarrhea.  Genitourinary:  Negative for dysuria, hematuria and urgency.  Musculoskeletal:  Negative for arthralgias and myalgias.  Allergic/Immunologic: Negative for environmental allergies.  Neurological:  Negative for dizziness, weakness, light-headedness and headaches.  Psychiatric/Behavioral:  Positive for depression. Negative for dysphoric mood and sleep disturbance. The patient is not nervous/anxious.      Lab Results  Component Value Date   NA 140 05/08/2022   K 4.2 05/08/2022   CO2 21 05/08/2022   GLUCOSE 101 (H) 05/08/2022   BUN 13 05/08/2022   CREATININE 0.81 05/08/2022   CALCIUM 9.6 05/08/2022   EGFR 91 05/08/2022   GFRNONAA 106 04/01/2019   Lab Results  Component Value Date   CHOL 183 05/08/2022   HDL 46 05/08/2022   LDLCALC 123 (H) 05/08/2022   TRIG 77 05/08/2022   CHOLHDL 4.0 05/08/2022   Lab Results  Component Value Date   TSH 0.532 05/08/2022   Lab Results  Component Value Date   HGBA1C 6.0 (H) 05/08/2022   Lab Results  Component Value Date   WBC 6.0 05/08/2022   HGB 15.5 05/08/2022   HCT 47.0 (H) 05/08/2022   MCV 91 05/08/2022   PLT 241 05/08/2022   Lab Results  Component Value Date   ALT 16 05/08/2022   AST 16 05/08/2022   ALKPHOS 101 05/08/2022   BILITOT 0.5 05/08/2022  Lab Results  Component Value Date   VD25OH 48.5 08/10/2015     Patient Active Problem List   Diagnosis Date Noted   Sleep-disordered breathing 05/12/2023   Prediabetes 07/02/2021   Raynaud's disease without gangrene 04/01/2019   Vitamin B12 deficiency 07/01/2017   Migraine variants, not intractable 05/05/2017   Chronic pain of both knees 05/05/2017   Major depressive disorder with single episode, in partial remission (HCC) 01/01/2016   Abnormal mammogram 12/28/2015   BMI 45.0-49.9, adult (HCC) 12/28/2015   Environmental and  seasonal allergies 08/10/2015   Hypothyroidism due to acquired atrophy of thyroid 08/10/2015   Vitamin D deficiency 08/10/2015   Generalized anxiety disorder 05/04/2015    Allergies  Allergen Reactions   Erythromycin    Azithromycin Nausea Only    And dizziness    Past Surgical History:  Procedure Laterality Date   BILATERAL CARPAL TUNNEL RELEASE Bilateral 2010   LEEP  2002   For abnormal pap, followed by cryosurgery    Social History   Tobacco Use   Smoking status: Never   Smokeless tobacco: Never  Substance Use Topics   Alcohol use: Yes    Alcohol/week: 1.0 standard drink of alcohol    Types: 1 Standard drinks or equivalent per week    Comment: Rarely   Drug use: No     Medication list has been reviewed and updated.  Current Meds  Medication Sig   Acetaminophen (TYLENOL PO) Take by mouth as needed.   Cetirizine HCl 10 MG CAPS    Cholecalciferol (DIALYVITE VITAMIN D 5000 PO) Take 1 tablet by mouth daily.   cyanocobalamin (VITAMIN B12) 500 MCG tablet Take 500 mcg by mouth daily.   etonogestrel (NEXPLANON) 68 MG IMPL implant 1 each by Subdermal route once.   Fluticasone Propionate (FLONASE ALLERGY RELIEF NA)    levothyroxine (SYNTHROID) 75 MCG tablet TAKE 1 TABLET BY MOUTH DAILY BEFORE BREAKFAST.   topiramate (TOPAMAX) 50 MG tablet Take 1 tablet (50 mg total) by mouth daily.   vortioxetine HBr (TRINTELLIX) 20 MG TABS tablet Take 1 tablet (20 mg total) by mouth daily.       05/12/2023    8:28 AM 05/08/2022    9:15 AM 05/06/2021    9:21 AM 10/23/2020    8:57 AM  GAD 7 : Generalized Anxiety Score  Nervous, Anxious, on Edge 0 1 0 0  Control/stop worrying 0 0 2 0  Worry too much - different things 0 1 2 0  Trouble relaxing 0 0 1 0  Restless 0 0 0 0  Easily annoyed or irritable 0 1 2 0  Afraid - awful might happen 0 1 0 0  Total GAD 7 Score 0 4 7 0  Anxiety Difficulty Not difficult at all Not difficult at all Not difficult at all Not difficult at all        05/12/2023    8:28 AM 05/08/2022    9:15 AM 05/06/2021    9:20 AM  Depression screen PHQ 2/9  Decreased Interest 0 0 1  Down, Depressed, Hopeless 0 0 1  PHQ - 2 Score 0 0 2  Altered sleeping 3 1 2   Tired, decreased energy 3 1 1   Change in appetite 1 1 1   Feeling bad or failure about yourself  0 0 0  Trouble concentrating 0 0 0  Moving slowly or fidgety/restless 0 0 0  Suicidal thoughts 0 0 0  PHQ-9 Score 7 3 6   Difficult doing work/chores Somewhat difficult Not  difficult at all Not difficult at all    BP Readings from Last 3 Encounters:  05/12/23 116/74  05/08/22 122/76  05/06/21 122/78    Physical Exam Vitals and nursing note reviewed.  Constitutional:      General: She is not in acute distress.    Appearance: She is well-developed.  HENT:     Head: Normocephalic and atraumatic.     Right Ear: Tympanic membrane and ear canal normal.     Left Ear: Tympanic membrane and ear canal normal.     Nose:     Right Sinus: No maxillary sinus tenderness.     Left Sinus: No maxillary sinus tenderness.  Eyes:     General: No scleral icterus.       Right eye: No discharge.        Left eye: No discharge.     Conjunctiva/sclera: Conjunctivae normal.  Neck:     Thyroid: No thyromegaly.     Vascular: No carotid bruit.  Cardiovascular:     Rate and Rhythm: Normal rate and regular rhythm.     Pulses: Normal pulses.     Heart sounds: Normal heart sounds.  Pulmonary:     Effort: Pulmonary effort is normal. No respiratory distress.     Breath sounds: No wheezing.  Chest:  Breasts:    Right: No mass, nipple discharge, skin change or tenderness.     Left: No mass, nipple discharge, skin change or tenderness.  Abdominal:     General: Bowel sounds are normal.     Palpations: Abdomen is soft.     Tenderness: There is no abdominal tenderness.  Musculoskeletal:     Cervical back: Normal range of motion. No erythema.     Right lower leg: No edema.     Left lower leg: No edema.   Lymphadenopathy:     Cervical: No cervical adenopathy.  Skin:    General: Skin is warm and dry.     Findings: No rash.  Neurological:     Mental Status: She is alert and oriented to person, place, and time.     Cranial Nerves: No cranial nerve deficit.     Sensory: No sensory deficit.     Deep Tendon Reflexes: Reflexes are normal and symmetric.  Psychiatric:        Attention and Perception: Attention normal.        Mood and Affect: Mood normal.       05/12/2023    8:45 AM  Results of the Epworth flowsheet  Sitting and reading 3  Watching TV 2  Sitting, inactive in a public place (e.g. a theatre or a meeting) 0  As a passenger in a car for an hour without a break 0  Lying down to rest in the afternoon when circumstances permit 3  Sitting and talking to someone 0  Sitting quietly after a lunch without alcohol 2  In a car, while stopped for a few minutes in traffic 0  Total score 10     Wt Readings from Last 3 Encounters:  05/12/23 259 lb 8 oz (117.7 kg)  05/08/22 284 lb (128.8 kg)  05/06/21 285 lb (129.3 kg)    BP 116/74   Pulse 77   Ht 5\' 4"  (1.626 m)   Wt 259 lb 8 oz (117.7 kg)   SpO2 100%   BMI 44.54 kg/m   Assessment and Plan:  Problem List Items Addressed This Visit       Unprioritized  Hypothyroidism due to acquired atrophy of thyroid   Supplemented.  Lab Results  Component Value Date   TSH 0.532 05/08/2022         Relevant Orders   TSH + free T4   Vitamin D deficiency   Continue daily supplement Lab Results  Component Value Date   VITAMINB12 >2000 (H) 05/08/2022            Major depressive disorder with single episode, in partial remission (HCC)   Clinically stable on Trintellix.   No SI or HI on evaluation. Plan to continue same medications for now.       Migraine variants, not intractable   Doing well on Topamax - followed by Neurology.      Vitamin B12 deficiency   On oral supplements daily. Lab Results  Component Value  Date   VITAMINB12 >2000 (H) 05/08/2022         Relevant Orders   CBC with Differential/Platelet   Prediabetes   Managed with diet alone.  Losing weight with [employer] and exercise. Continue current regimen unless labs abnormal. Lab Results  Component Value Date   HGBA1C 6.0 (H) 05/08/2022         Relevant Orders   Comprehensive metabolic panel with GFR   Hemoglobin A1c   Sleep-disordered breathing   Symptoms are worsening and suggestive of sleep apnea. She will call her insurance for a list of covered home sleep study providers so that I can place a correct referral Continue to avoid sleeping supine.      Other Visit Diagnoses       Annual physical exam    -  Primary   continue WW and exercise  up to date on screenings.   Relevant Orders   CBC with Differential/Platelet   Comprehensive metabolic panel with GFR   Hemoglobin A1c   Lipid panel   TSH + free T4   MM 3D SCREENING MAMMOGRAM BILATERAL BREAST     Encounter for screening mammogram for breast cancer       schedule in August at [facility]     Mild hyperlipidemia       Relevant Orders   Lipid panel       No follow-ups on file.    Reubin Milan, MD [facility] [place]

## 2023-05-12 NOTE — Assessment & Plan Note (Addendum)
 Managed with diet alone.  Losing weight with [employer] and exercise. Continue current regimen unless labs abnormal. Lab Results  Component Value Date   HGBA1C 6.0 (H) 05/08/2022

## 2023-05-12 NOTE — Assessment & Plan Note (Signed)
 Symptoms are worsening and suggestive of sleep apnea. She will call her insurance for a list of covered home sleep study providers so that I can place a correct referral Continue to avoid sleeping supine.

## 2023-05-13 ENCOUNTER — Encounter: Payer: Self-pay | Admitting: Internal Medicine

## 2023-05-13 LAB — CBC WITH DIFFERENTIAL/PLATELET
Basophils Absolute: 0.1 10*3/uL (ref 0.0–0.2)
Basos: 1 %
EOS (ABSOLUTE): 0.2 10*3/uL (ref 0.0–0.4)
Eos: 2 %
Hematocrit: 44.8 % (ref 34.0–46.6)
Hemoglobin: 14.8 g/dL (ref 11.1–15.9)
Immature Grans (Abs): 0 10*3/uL (ref 0.0–0.1)
Immature Granulocytes: 0 %
Lymphocytes Absolute: 2 10*3/uL (ref 0.7–3.1)
Lymphs: 32 %
MCH: 30.5 pg (ref 26.6–33.0)
MCHC: 33 g/dL (ref 31.5–35.7)
MCV: 92 fL (ref 79–97)
Monocytes Absolute: 0.3 10*3/uL (ref 0.1–0.9)
Monocytes: 5 %
Neutrophils Absolute: 3.6 10*3/uL (ref 1.4–7.0)
Neutrophils: 60 %
Platelets: 215 10*3/uL (ref 150–450)
RBC: 4.85 x10E6/uL (ref 3.77–5.28)
RDW: 11.8 % (ref 11.7–15.4)
WBC: 6.2 10*3/uL (ref 3.4–10.8)

## 2023-05-13 LAB — COMPREHENSIVE METABOLIC PANEL WITH GFR
ALT: 16 IU/L (ref 0–32)
AST: 14 IU/L (ref 0–40)
Albumin: 4.1 g/dL (ref 3.9–4.9)
Alkaline Phosphatase: 94 IU/L (ref 44–121)
BUN/Creatinine Ratio: 18 (ref 9–23)
BUN: 12 mg/dL (ref 6–24)
Bilirubin Total: 0.4 mg/dL (ref 0.0–1.2)
CO2: 20 mmol/L (ref 20–29)
Calcium: 9.5 mg/dL (ref 8.7–10.2)
Chloride: 107 mmol/L — ABNORMAL HIGH (ref 96–106)
Creatinine, Ser: 0.67 mg/dL (ref 0.57–1.00)
Globulin, Total: 2.6 g/dL (ref 1.5–4.5)
Glucose: 89 mg/dL (ref 70–99)
Potassium: 4.5 mmol/L (ref 3.5–5.2)
Sodium: 140 mmol/L (ref 134–144)
Total Protein: 6.7 g/dL (ref 6.0–8.5)
eGFR: 108 mL/min/{1.73_m2} (ref >59)

## 2023-05-13 LAB — TSH+FREE T4
Free T4: 1.26 ng/dL (ref 0.82–1.77)
TSH: 0.592 u[IU]/mL (ref 0.450–4.500)

## 2023-05-13 LAB — LIPID PANEL
Chol/HDL Ratio: 4.1 ratio (ref 0.0–4.4)
Cholesterol, Total: 181 mg/dL (ref 100–199)
HDL: 44 mg/dL (ref >39)
LDL Chol Calc (NIH): 121 mg/dL — ABNORMAL HIGH (ref 0–99)
Triglycerides: 88 mg/dL (ref 0–149)
VLDL Cholesterol Cal: 16 mg/dL (ref 5–40)

## 2023-05-13 LAB — HEMOGLOBIN A1C
Est. average glucose Bld gHb Est-mCnc: 120 mg/dL
Hgb A1c MFr Bld: 5.8 % — ABNORMAL HIGH (ref 4.8–5.6)

## 2023-05-13 NOTE — Telephone Encounter (Signed)
 Marland Kitchen

## 2023-05-13 NOTE — Telephone Encounter (Signed)
 I've placed the form in your basket for you to review.

## 2023-06-26 ENCOUNTER — Other Ambulatory Visit: Payer: Self-pay | Admitting: Internal Medicine

## 2023-06-26 DIAGNOSIS — F324 Major depressive disorder, single episode, in partial remission: Secondary | ICD-10-CM

## 2023-06-30 NOTE — Telephone Encounter (Signed)
 Requested Prescriptions  Pending Prescriptions Disp Refills   TRINTELLIX  20 MG TABS tablet [Pharmacy Med Name: TRINTELLIX  20MG  TAB] 90 tablet 1    Sig: TAKE ONE TABLET BY MOUTH EVERY DAY     Psychiatry: Antidepressants - Serotonin Modulator Passed - 06/30/2023  2:43 PM      Passed - Completed PHQ-2 or PHQ-9 in the last 360 days      Passed - Valid encounter within last 6 months    Recent Outpatient Visits           1 month ago Annual physical exam   [facility] at [facility], [place], [place]

## 2023-07-20 ENCOUNTER — Other Ambulatory Visit: Payer: Self-pay | Admitting: Internal Medicine

## 2023-07-20 DIAGNOSIS — G43809 Other migraine, not intractable, without status migrainosus: Secondary | ICD-10-CM

## 2023-07-22 ENCOUNTER — Encounter: Payer: Self-pay | Admitting: Internal Medicine

## 2023-07-23 NOTE — Telephone Encounter (Signed)
 Requested Prescriptions  Pending Prescriptions Disp Refills   topiramate  (TOPAMAX ) 50 MG tablet [Pharmacy Med Name: TOPIRAMATE  50MG  TABLETS] 90 tablet 0    Sig: TAKE 1 TABLET(50 MG) BY MOUTH DAILY     Neurology: Anticonvulsants - topiramate  & zonisamide Passed - 07/23/2023  8:33 AM      Passed - Cr in normal range and within 360 days    Creatinine, Ser  Date Value Ref Range Status  05/12/2023 0.67 0.57 - 1.00 mg/dL Final         Passed - CO2 in normal range and within 360 days    CO2  Date Value Ref Range Status  05/12/2023 20 20 - 29 mmol/L Final         Passed - ALT in normal range and within 360 days    ALT  Date Value Ref Range Status  05/12/2023 16 0 - 32 IU/L Final         Passed - AST in normal range and within 360 days    AST  Date Value Ref Range Status  05/12/2023 14 0 - 40 IU/L Final         Passed - Completed PHQ-2 or PHQ-9 in the last 360 days      Passed - Valid encounter within last 12 months    Recent Outpatient Visits           2 months ago Annual physical exam   [facility] at [facility], [place], [place]

## 2023-08-04 ENCOUNTER — Encounter: Payer: Self-pay | Admitting: Internal Medicine

## 2023-10-22 ENCOUNTER — Other Ambulatory Visit: Payer: Self-pay | Admitting: Internal Medicine

## 2023-10-22 DIAGNOSIS — G43809 Other migraine, not intractable, without status migrainosus: Secondary | ICD-10-CM

## 2023-10-22 NOTE — Telephone Encounter (Signed)
 Requested Prescriptions  Pending Prescriptions Disp Refills   topiramate  (TOPAMAX ) 50 MG tablet [Pharmacy Med Name: TOPIRAMATE  50MG  TABLETS] 90 tablet 1    Sig: TAKE 1 TABLET(50 MG) BY MOUTH DAILY     Neurology: Anticonvulsants - topiramate  & zonisamide Passed - 10/22/2023  5:04 PM      Passed - Cr in normal range and within 360 days    Creatinine, Ser  Date Value Ref Range Status  05/12/2023 0.67 0.57 - 1.00 mg/dL Final         Passed - CO2 in normal range and within 360 days    CO2  Date Value Ref Range Status  05/12/2023 20 20 - 29 mmol/L Final         Passed - ALT in normal range and within 360 days    ALT  Date Value Ref Range Status  05/12/2023 16 0 - 32 IU/L Final         Passed - AST in normal range and within 360 days    AST  Date Value Ref Range Status  05/12/2023 14 0 - 40 IU/L Final         Passed - Completed PHQ-2 or PHQ-9 in the last 360 days      Passed - Valid encounter within last 12 months    Recent Outpatient Visits           5 months ago Annual physical exam   [facility] at [facility], [place], [place]
